# Patient Record
Sex: Female | Born: 1950 | Race: White | Hispanic: No | Marital: Single | Smoking: Current every day smoker
Health system: Southern US, Community
[De-identification: ages and names within clinical notes are randomized; demographics above are authoritative.]

## PROBLEM LIST (undated history)

## (undated) DIAGNOSIS — E785 Hyperlipidemia, unspecified: Secondary | ICD-10-CM

## (undated) DIAGNOSIS — K219 Gastro-esophageal reflux disease without esophagitis: Secondary | ICD-10-CM

## (undated) DIAGNOSIS — I10 Essential (primary) hypertension: Secondary | ICD-10-CM

## (undated) DIAGNOSIS — E079 Disorder of thyroid, unspecified: Secondary | ICD-10-CM

## (undated) DIAGNOSIS — N200 Calculus of kidney: Secondary | ICD-10-CM

## (undated) DIAGNOSIS — M1990 Unspecified osteoarthritis, unspecified site: Secondary | ICD-10-CM

## (undated) DIAGNOSIS — E039 Hypothyroidism, unspecified: Secondary | ICD-10-CM

## (undated) HISTORY — PX: FEMUR CLOSED REDUCTION: SHX939

## (undated) HISTORY — PX: SHOULDER SURGERY: SHX246

## (undated) HISTORY — PX: ABDOMINAL HYSTERECTOMY: SHX81

---

## 1998-01-06 ENCOUNTER — Ambulatory Visit (HOSPITAL_COMMUNITY): Admission: RE | Admit: 1998-01-06 | Discharge: 1998-01-06 | Payer: Self-pay | Admitting: Family Medicine

## 1999-02-10 ENCOUNTER — Ambulatory Visit (HOSPITAL_COMMUNITY): Admission: RE | Admit: 1999-02-10 | Discharge: 1999-02-10 | Payer: Self-pay | Admitting: Family Medicine

## 1999-03-05 ENCOUNTER — Ambulatory Visit (HOSPITAL_COMMUNITY): Admission: RE | Admit: 1999-03-05 | Discharge: 1999-03-05 | Payer: Self-pay | Admitting: Gastroenterology

## 2000-02-07 ENCOUNTER — Ambulatory Visit (HOSPITAL_BASED_OUTPATIENT_CLINIC_OR_DEPARTMENT_OTHER): Admission: RE | Admit: 2000-02-07 | Discharge: 2000-02-08 | Payer: Self-pay | Admitting: Orthopedic Surgery

## 2000-06-01 ENCOUNTER — Ambulatory Visit (HOSPITAL_COMMUNITY): Admission: RE | Admit: 2000-06-01 | Discharge: 2000-06-01 | Payer: Self-pay | Admitting: Family Medicine

## 2000-06-01 ENCOUNTER — Encounter: Payer: Self-pay | Admitting: Family Medicine

## 2000-06-12 ENCOUNTER — Encounter: Admission: RE | Admit: 2000-06-12 | Discharge: 2000-06-12 | Payer: Self-pay | Admitting: Family Medicine

## 2000-06-12 ENCOUNTER — Encounter: Payer: Self-pay | Admitting: Family Medicine

## 2000-08-07 ENCOUNTER — Ambulatory Visit (HOSPITAL_COMMUNITY): Admission: RE | Admit: 2000-08-07 | Discharge: 2000-08-07 | Payer: Self-pay | Admitting: Gastroenterology

## 2001-06-22 ENCOUNTER — Ambulatory Visit (HOSPITAL_COMMUNITY): Admission: RE | Admit: 2001-06-22 | Discharge: 2001-06-22 | Payer: Self-pay | Admitting: Family Medicine

## 2001-06-22 ENCOUNTER — Encounter: Payer: Self-pay | Admitting: Family Medicine

## 2002-06-24 ENCOUNTER — Ambulatory Visit (HOSPITAL_COMMUNITY): Admission: RE | Admit: 2002-06-24 | Discharge: 2002-06-24 | Payer: Self-pay | Admitting: Family Medicine

## 2002-06-24 ENCOUNTER — Encounter: Payer: Self-pay | Admitting: Family Medicine

## 2003-09-17 ENCOUNTER — Ambulatory Visit (HOSPITAL_COMMUNITY): Admission: RE | Admit: 2003-09-17 | Discharge: 2003-09-17 | Payer: Self-pay | Admitting: Family Medicine

## 2003-12-22 ENCOUNTER — Ambulatory Visit (HOSPITAL_COMMUNITY): Admission: RE | Admit: 2003-12-22 | Discharge: 2003-12-22 | Payer: Self-pay | Admitting: Orthopedic Surgery

## 2004-02-09 ENCOUNTER — Ambulatory Visit (HOSPITAL_BASED_OUTPATIENT_CLINIC_OR_DEPARTMENT_OTHER): Admission: RE | Admit: 2004-02-09 | Discharge: 2004-02-09 | Payer: Self-pay | Admitting: Orthopedic Surgery

## 2004-02-09 ENCOUNTER — Ambulatory Visit (HOSPITAL_COMMUNITY): Admission: RE | Admit: 2004-02-09 | Discharge: 2004-02-09 | Payer: Self-pay | Admitting: Orthopedic Surgery

## 2004-10-04 ENCOUNTER — Ambulatory Visit (HOSPITAL_COMMUNITY): Admission: RE | Admit: 2004-10-04 | Discharge: 2004-10-04 | Payer: Self-pay | Admitting: Family Medicine

## 2005-09-09 ENCOUNTER — Ambulatory Visit (HOSPITAL_COMMUNITY): Admission: RE | Admit: 2005-09-09 | Discharge: 2005-09-09 | Payer: Self-pay | Admitting: Gastroenterology

## 2005-10-06 ENCOUNTER — Ambulatory Visit (HOSPITAL_COMMUNITY): Admission: RE | Admit: 2005-10-06 | Discharge: 2005-10-06 | Payer: Self-pay | Admitting: Family Medicine

## 2006-10-09 ENCOUNTER — Ambulatory Visit (HOSPITAL_COMMUNITY): Admission: RE | Admit: 2006-10-09 | Discharge: 2006-10-09 | Payer: Self-pay | Admitting: Family Medicine

## 2007-10-12 ENCOUNTER — Ambulatory Visit (HOSPITAL_COMMUNITY): Admission: RE | Admit: 2007-10-12 | Discharge: 2007-10-12 | Payer: Self-pay | Admitting: Family Medicine

## 2008-10-17 ENCOUNTER — Ambulatory Visit (HOSPITAL_COMMUNITY): Admission: RE | Admit: 2008-10-17 | Discharge: 2008-10-17 | Payer: Self-pay | Admitting: Family Medicine

## 2009-07-15 ENCOUNTER — Ambulatory Visit: Payer: Self-pay | Admitting: Sports Medicine

## 2009-07-15 DIAGNOSIS — M216X9 Other acquired deformities of unspecified foot: Secondary | ICD-10-CM

## 2009-07-15 DIAGNOSIS — M2170 Unequal limb length (acquired), unspecified site: Secondary | ICD-10-CM

## 2009-07-15 DIAGNOSIS — M7750 Other enthesopathy of unspecified foot: Secondary | ICD-10-CM

## 2009-08-31 ENCOUNTER — Ambulatory Visit: Payer: Self-pay | Admitting: Sports Medicine

## 2009-10-19 ENCOUNTER — Ambulatory Visit (HOSPITAL_COMMUNITY): Admission: RE | Admit: 2009-10-19 | Discharge: 2009-10-19 | Payer: Self-pay | Admitting: Family Medicine

## 2010-08-31 ENCOUNTER — Ambulatory Visit (HOSPITAL_COMMUNITY)
Admission: RE | Admit: 2010-08-31 | Discharge: 2010-08-31 | Payer: Self-pay | Source: Home / Self Care | Admitting: Gastroenterology

## 2010-10-25 ENCOUNTER — Ambulatory Visit (HOSPITAL_COMMUNITY)
Admission: RE | Admit: 2010-10-25 | Discharge: 2010-10-25 | Payer: Self-pay | Source: Home / Self Care | Attending: Family Medicine | Admitting: Family Medicine

## 2010-10-27 ENCOUNTER — Other Ambulatory Visit: Payer: Self-pay | Admitting: Family Medicine

## 2010-10-27 DIAGNOSIS — R928 Other abnormal and inconclusive findings on diagnostic imaging of breast: Secondary | ICD-10-CM

## 2010-10-29 ENCOUNTER — Ambulatory Visit
Admission: RE | Admit: 2010-10-29 | Discharge: 2010-10-29 | Disposition: A | Discharge status: Home / Self Care | Payer: Commercial Managed Care - PPO | Source: Ambulatory Visit | Attending: Family Medicine | Admitting: Family Medicine

## 2010-10-29 DIAGNOSIS — R928 Other abnormal and inconclusive findings on diagnostic imaging of breast: Secondary | ICD-10-CM

## 2010-12-01 ENCOUNTER — Encounter: Payer: Self-pay | Admitting: *Deleted

## 2010-12-30 ENCOUNTER — Other Ambulatory Visit: Payer: Self-pay | Admitting: Family Medicine

## 2011-01-03 ENCOUNTER — Other Ambulatory Visit: Payer: Self-pay | Admitting: Family Medicine

## 2011-01-03 ENCOUNTER — Ambulatory Visit
Admission: RE | Admit: 2011-01-03 | Discharge: 2011-01-03 | Disposition: A | Discharge status: Home / Self Care | Payer: Commercial Managed Care - PPO | Source: Ambulatory Visit | Attending: Family Medicine | Admitting: Family Medicine

## 2011-02-11 NOTE — Op Note (Signed)
NAME:  Marie Dominguez, Marie Dominguez                  ACCOUNT NO.:  [id number]   MEDICAL RECORD NO.:  [id number]          PATIENT TYPE:  AMB   LOCATION:  ENDO                         FACILITY:  [facility]   PHYSICIAN:  Bernette Redbird, M.D.   DATE OF BIRTH:  1951-09-04   DATE OF PROCEDURE:  09/09/2005  DATE OF DISCHARGE:                                 OPERATIVE REPORT   PROCEDURE:  Colonoscopy.   INDICATION:  Strong family history of colon cancer in a 60 year old female,  with two prior colonoscopies having been negative.   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   PROCEDURE:  The nature, purpose and risks of the procedure were familiar to  the patient from prior examinations and she provided written consent.  Sedation was fentanyl 100 mcg and Versed 10 mg IV without arrhythmias or  desaturation.  The Olympus adjustable-tension pediatric video colonoscope  was quite easily advanced to the terminal ileum, using little bit of  external abdominal compression to control looping.  The terminal ileum  looked normal.  Pullback was then performed.  The quality of the prep was  excellent.  It is felt that all areas were well-seen.   The patient did have significant melanosis coli, which appeared to be a new  finding few finding compared to her last exam five years ago, but was  otherwise normal, without evidence of polyps, cancer, colitis, vascular  malformations or diverticulosis.  A retroflexed view of the rectum and  reinspection of the rectum of the rectum were unremarkable.   No biopsies were obtained.  The patient tolerated the procedure well, and  there were no apparent complications.   IMPRESSION:  Melanosis coli, otherwise normal exam in a patient with a  strong family history of colon cancer (V16.0).   PLAN:  Repeat colonoscopy in five years.           ______________________________  Bernette Redbird, M.D.     RB/MEDQ  D:  09/09/2005  T:  09/12/2005  Job:   [id number]   cc:   Duncan Dull, M.D.  Fax: [phone]

## 2011-02-11 NOTE — Procedures (Signed)
[place]. [facility]  Patient:    Marie Dominguez, Marie Dominguez                         MRN: [id number] Proc. Date: 08/07/00 Adm. Date:  [id number] Attending:  Rich Brave CC:         Duncan Dull, M.D.   Procedure Report  PROCEDURE PERFORMED:  Colonoscopy.  ENDOSCOPIST:  Florencia Reasons, M.D.  INDICATIONS FOR PROCEDURE:  Screening for colon cancer in a 60 year old female who last had colonoscopy five years ago at which time it was normal.  FINDINGS:  Normal exam.  DESCRIPTION OF PROCEDURE:  The nature, purpose and risks of the procedure were familiar to the patient from prior examination and she provided written consent.  Sedation was fentanyl 80 mcg and Versed 8 mg IV without arrhythmias or desaturation.  The Olympus pediatric video colonoscope was advanced to the terminal ileum without undue difficulty, applying a little bit of external abdominal compression to facilitate passage.  Pullback was then performed.  The quality of the prep was excellent and it is felt that all areas were well seen.  This was a normal examination.  Specifically, no polyps, cancer, colitis, vascular malformations or diverticular disease were observed and retroflexion in the rectum was unremarkable.  No biopsies were obtanied.  The patient tolerated the procedure well and there were no apparent complications.  IMPRESSION:  Normal screening colonoscopy in a patient with a family history of colon cancer.  PLAN:  Follow-up colonoscopy in five years in view of the family history.  DD:  08/07/00 TD:  08/07/00 Job: [id number]

## 2011-02-11 NOTE — Op Note (Signed)
[place]. [facility]  Patient:    Marie Dominguez, Marie Dominguez                         MRN: [id number] Proc. Date: 02/07/00 Adm. Date:  [id number] Attending:  Twana First                           Operative Report  PREOPERATIVE DIAGNOSIS:  Right shoulder rotator cuff tendinitis and impingement.  POSTOPERATIVE DIAGNOSES: 1. Right shoulder rotator cuff tear. 2. Right shoulder impingement with partial labrum tear.  PROCEDURES: 1. Right shoulder examination under anesthesia, followed by arthroscopic    partial labral tear debridement and subacromial decompression. 2. Right shoulder open rotator cuff repair.  SURGEON:  Elana Alm. Thurston Hole, M.D.  ASSISTANT:  Janine Limbo, P.A.  ANESTHESIA:  General.  OPERATIVE TIME:   45 minutes.  COMPLICATIONS:  None.  INDICATIONS:  [name] is a 60 year old woman, who has had one to two years of increasing right shoulder pain with signs and symptoms consistent with rotator cuff tendinitis and impingement.  She has failed conservative care and is now to undergo arthroscopy.  DESCRIPTION OF PROCEDURE:  [name] was brought to the operating room on Feb 07, 2000 after supraclavicular block had been placed in the holding room. Placed on the operating room table in supine position.  After an adequate level of general anesthesia was obtained, her right shoulder was examined under anesthesia.  She had full range of motion.  Shoulder was stable to ligamentous examination.  She was placed in a beach chair position and her shoulder and arm was prepped using sterile Betadine and draped using sterile technique.  Originally, through a posterior arthroscopic portal, the arthroscope with a pump attachment was placed and through an anterior portal, an arthroscopic probe was placed.  On initial inspection, the articular cartilage and the glenohumeral joint showed mild grade 1 and 2 changes. Anterior labrum was intact.  She had a very  prominent middle glenohumeral ligament, which was left undisturbed.  Superior labrum partial tearing 25-30%, which was debrided.  Biceps tendon anchor was hypermobile, but did not appear to be torn.  Biceps tendon showed a 25% partial tear, which was debrided. Posterior labrum was intact. Inferior capsular recess was free of pathology. Rotator cuff showed a complete tear to the supraspinatus and partial tear of the infraspinatus.  The subscapularis was intact.  Subacromial space was entered and a lateral arthroscopic portal was made.  Subtotal bursectomy was carried out.  Subacromial decompression was carried out removing 6-8 mm of the undersurface of the anterior, anterolateral and anteromedial acromion.  At this point, through the lateral portal, a 3 cm deltoid-splitting incision was made.  Underlying subcutaneous tissues were incised in line with the skin incision.  Rotator cuff tear was easily visualized and the edges were sharply debrided.  A small trough made in the greater tuberosity.  Initially, the tendon was repaired tendon to tendon with #2 Panacryl suture and then through two drill holes in the greater tuberosity, the tendon was tied down to the greater tuberosity and tied over bone thus reapproximating the tendon back to itself, as well as, to a trough in the greater tuberosity.  Firm fixation repair was achieved.  The shoulder could then be brought through a full range of motion with no impingement on the repair after this was done.  Wound was irrigated and closed using interrupted 0  and 2-0 Vicryl sutures, 3-0 Prolene in the subcuticular layers, Steri-Strips were applied.  The wound injected with 0.25% Marcaine with epinephrine.  Sterile dressings and a sling applied and the patient awakened and taken to recovery room in stable condition.  FOLLOW-UP CARE:   [name] will be followed overnight at the recovery care center for IV pain control and neurovascular monitoring.   Discharge tomorrow on Percocet and Naprosyn.  See me back in my office in a week for sutures out and follow-up. DD:  02/07/00 TD:  02/08/00 Job: [id number]

## 2011-02-11 NOTE — Op Note (Signed)
NAME:  Marie Dominguez, Marie Dominguez                            ACCOUNT NO.:  [id number]   MEDICAL RECORD NO.:  [id number]                   PATIENT TYPE:  AMB   LOCATION:  DSC                                  FACILITY:  [facility]   PHYSICIAN:  Robert A. Thurston Hole, M.D.              DATE OF BIRTH:  [birth year]   DATE OF PROCEDURE:  02/09/2004  DATE OF DISCHARGE:                                 OPERATIVE REPORT   PREOPERATIVE DIAGNOSES:  1. Left shoulder rotator cuff tear.  2. Left shoulder partial labrum tear.  3. Left shoulder impingement.   POSTOPERATIVE DIAGNOSES:  1. Left shoulder rotator cuff tear.  2. Left shoulder partial labrum tear.  3. Left shoulder impingement.   OPERATION PERFORMED:  1. Left shoulder examination under anesthesia followed by arthroscopically     assisted rotator cuff repair using Arthrex suture anchor times one.  2. Left shoulder partial labral tear debridement.  3. Left shoulder subacromial decompression.   SURGEON:  Elana Alm. Thurston Hole, M.D.   ASSISTANT:  Julien Girt, P.A.   ANESTHESIA:  General.   OPERATIVE TIME:  One hour.   COMPLICATIONS:  None.   INDICATIONS FOR PROCEDURE:  Marie Dominguez is a 60 year old woman who has had  three to four months of increasing left shoulder pain with exam and MRI  documenting a rotator cuff tear who has failed conservative care and is now  to undergo arthroscopy and repair.   DESCRIPTION OF PROCEDURE:  Marie Dominguez was brought to the operating room on Feb 09, 2004 after an interscalene block had been placed in the holding room by  anesthesia for postoperative pain control.  She was placed on the operating  table in supine position.  After being placed under general anesthesia, her  left shoulder was examined under anesthesia.  She had full range of motion  and her shoulder was stable to ligamentous exam.  She was then placed in  beach chair position and her shoulder and arm were prepped using sterile  DuraPrep and draped using  sterile technique.  Originally through a posterior  arthroscopic portal, the arthroscope with a pump attached was placed and  through an anterior portal, an arthroscopic probe was placed.  On initial  inspection, the articular cartilage in the glenohumeral joint was intact.  Anterior superior labrum, partial tear 25 to 30% which was debrided, biceps  tendon anchor and biceps tendon was intact.  Inferior labrum and anterior  inferior glenohumeral ligament complex was intact.  The posterior labrum was  intact.  The rotator cuff showed a tear of the supraspinatus which was a 90%  near complete tear and this was partially debrided arthroscopically.  The  rest of the rotator cuff was intact.  The inferior capsular recess was free  of pathology.  The subacromial space was entered and a lateral arthroscopic  portal was made.  Moderately thickened bursitis was resected.  The  rotator  cuff tear was further visualized and it was further debrided  arthroscopically.  Impingement was noted and a subacromial decompression was  carried out removing 6 mm of the undersurface of the anterior, anterolateral  and anteromedial acromion and coracoacromial ligament release carried out as  well.  The acromioclavicular joint was not disturbed.  After this was done,  then through an accessory portal, a 5.0 mm Arthrex suture anchor was placed  in the greater tuberosity.  Each of the sutures was then passed through the  rotator cuff tear and tied down arthroscopically, thus repairing the rotator  cuff tear back down to the greater tuberosity with a firm and tight repair.  After this was done, no further pathology was noted.  The shoulder could be  brought through a full range of motion with no impingement on the repair.  At this point it was felt that all of the pathology had been satisfactorily  addressed.  The instruments were removed.  Portals were closed with 3-0  nylon suture.  Sterile dressings and a sling  applied.  The patient was  awakened and taken to the recovery room in stable condition.   FOLLOW UP:  Marie Dominguez will be followed as an outpatient on Percocet for pain  and Naprosyn.  See her back in the office in a week for suture removal and  follow-up.  Begin early physical therapy for passive range of motion only  times six weeks, then active thereafter.                                               Robert A. Thurston Hole, M.D.    RAW/MEDQ  D:  02/09/2004  T:  02/09/2004  Job:  [id number]

## 2011-07-27 ENCOUNTER — Ambulatory Visit (INDEPENDENT_AMBULATORY_CARE_PROVIDER_SITE_OTHER): Payer: Commercial Managed Care - PPO | Admitting: Family Medicine

## 2011-07-27 VITALS — BP 124/80 | Ht 68.0 in | Wt 185.0 lb

## 2011-07-27 DIAGNOSIS — S93409A Sprain of unspecified ligament of unspecified ankle, initial encounter: Secondary | ICD-10-CM

## 2011-07-27 DIAGNOSIS — M25579 Pain in unspecified ankle and joints of unspecified foot: Secondary | ICD-10-CM

## 2011-07-27 DIAGNOSIS — M79609 Pain in unspecified limb: Secondary | ICD-10-CM

## 2011-07-27 NOTE — Patient Instructions (Signed)
Please cut your regular walking from 2 miles a day to one mile a day for about three days. Ice for 15-20 minutes after each exercise period. We have fitted you for a brace---wear it during walking activities. I have given you  A handout for ankle exercises---you can start that on day four if you are feeling better. If you are not progressing back to normal within a week, give [place] a call at [phone].

## 2011-07-28 NOTE — Progress Notes (Signed)
  Subjective:    Patient ID: Marie Dominguez, female    DOB: [birth year], 60 y.o.   MRN: [id number]  HPI  Right ankle pain since yesterday. Was walking on flat ground in flat shoes when she felt her ankle and felt immediate pain. Did not step in a hole. Since then she has had intermittent sharp pains when she walks. Pain is in the anterior part of the ankle. Has not had any swelling of the ankle, has noted no warmth or erythema of the ankle. 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 History of femur fracture  many years ago. 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 Review of Systems Denies numbness in the foot. Denies fever.    Objective:   Physical Exam Vital signs reviewed. GENERAL: Well developed, well nourished, no acute distress ANKLE: Right ankle normal in point on anterior drawer. Painless and full range of motion in E. version and inversion. Subtalar joint motion is normal. Mild tenderness to palpation over the ATF area. There is no bruising or swelling here. ULTRASOUND:  Small amount of edema around the ATF area but no true ankle effusion.       Assessment & Plan:  #1. ATF sprain mild. Put her in an ASO. See patient instructions. Rehabilitation handout given. She's not improving within the next 4-7 days she'll let us know.

## 2011-10-25 ENCOUNTER — Other Ambulatory Visit (HOSPITAL_COMMUNITY): Payer: Self-pay | Admitting: Family Medicine

## 2011-10-25 DIAGNOSIS — Z1231 Encounter for screening mammogram for malignant neoplasm of breast: Secondary | ICD-10-CM

## 2011-11-06 ENCOUNTER — Encounter (HOSPITAL_COMMUNITY): Payer: Self-pay

## 2011-11-06 ENCOUNTER — Emergency Department (HOSPITAL_COMMUNITY)
Admission: EM | Admit: 2011-11-06 | Discharge: 2011-11-06 | Disposition: A | Discharge status: Home / Self Care | Payer: 59 | Source: Home / Self Care | Attending: Family Medicine | Admitting: Family Medicine

## 2011-11-06 DIAGNOSIS — S0083XA Contusion of other part of head, initial encounter: Secondary | ICD-10-CM

## 2011-11-06 HISTORY — DX: Essential (primary) hypertension: I10

## 2011-11-06 HISTORY — DX: Disorder of thyroid, unspecified: E07.9

## 2011-11-06 HISTORY — DX: Gastro-esophageal reflux disease without esophagitis: K21.9

## 2011-11-06 NOTE — ED Notes (Signed)
Pt states she fell yesterday while walking, landed face first on sidewalk and broke her glasses. Has no loss of cons. Denies headache or n/v. Pt has large amount of brusing to eyes, bridge of nose and forehead.

## 2011-11-06 NOTE — ED Provider Notes (Addendum)
History     CSN: [id number]  Arrival date & time 11/06/11  1400   First MD Initiated Contact with Patient 11/06/11 1433      Chief Complaint  Patient presents with  . Fall    edema and brusing to bilateral eyes, bridge of nose and forehead    (Consider location/radiation/quality/duration/timing/severity/associated sxs/prior treatment) Patient is a 61 y.o. female presenting with facial injury. The history is provided by the patient.  Facial Injury  The incident occurred yesterday. The incident occurred in the street. The injury mechanism was a fall. Context: walking and ankle turned resulting in fall onto face. Pertinent negatives include no headaches.    Past Medical History  Diagnosis Date  . Thyroid disease   . Hypertension   . Acid reflux     Past Surgical History  Procedure Date  . Femur closed reduction   . Shoulder surgery   . Abdominal hysterectomy     History reviewed. No pertinent family history.  History  Substance Use Topics  . Smoking status: Never Smoker   . Smokeless tobacco: Not on file  . Alcohol Use: 0.6 oz/week    1 Glasses of wine per week    OB History    Grav Para Term Preterm Abortions TAB SAB Ect Mult Living                  Review of Systems  Constitutional: Negative.   Skin: Positive for color change.  Neurological: Negative for dizziness and headaches.    Allergies  Review of patient's allergies indicates no known allergies.  Home Medications   Current Outpatient Rx  Name Route Sig Dispense Refill  . CITALOPRAM HYDROBROMIDE 10 MG PO TABS Oral Take 10 mg by mouth daily.    Marland Kitchen ESOMEPRAZOLE MAGNESIUM 20 MG PO CPDR Oral Take 20 mg by mouth daily before breakfast.    . LEVOTHYROXINE SODIUM 100 MCG PO TABS Oral Take 100 mcg by mouth daily.    Marland Kitchen LISINOPRIL 10 MG PO TABS Oral Take 10 mg by mouth daily.    Marland Kitchen ROSUVASTATIN CALCIUM 10 MG PO TABS Oral Take 10 mg by mouth daily.    Marland Kitchen DICLOFENAC SODIUM 1 % TD GEL Topical Apply topically 4  (four) times daily. 1 gram to the affected area       BP 143/85  Pulse 63  Temp(Src) 99 F (37.2 C) (Oral)  Resp 18  SpO2 98%  Physical Exam  Nursing note and vitals reviewed. Constitutional: She is oriented to person, place, and time. She appears well-developed and well-nourished.  HENT:  Head: Normocephalic. Head is with contusion.    Right Ear: External ear normal.  Left Ear: External ear normal.  Mouth/Throat: Oropharynx is clear and moist.  Eyes: Conjunctivae and EOM are normal. Pupils are equal, round, and reactive to light.  Neck: Normal range of motion. Neck supple.  Musculoskeletal: Normal range of motion.  Neurological: She is alert and oriented to person, place, and time.  Skin: Skin is warm and dry.  Psychiatric: She has a normal mood and affect.    ED Course  Procedures (including critical care time)  Labs Reviewed - No data to display No results found.   1. Forehead contusion       MDM          Barkley Bruns, MD 11/06/11 1508  Barkley Bruns, MD 11/11/11 [phone]

## 2011-11-22 ENCOUNTER — Ambulatory Visit (HOSPITAL_COMMUNITY): Payer: Commercial Managed Care - PPO

## 2011-12-13 ENCOUNTER — Ambulatory Visit (HOSPITAL_COMMUNITY)
Admission: RE | Admit: 2011-12-13 | Discharge: 2011-12-13 | Disposition: A | Discharge status: Home / Self Care | Payer: 59 | Source: Ambulatory Visit | Attending: Family Medicine | Admitting: Family Medicine

## 2011-12-13 DIAGNOSIS — Z1231 Encounter for screening mammogram for malignant neoplasm of breast: Secondary | ICD-10-CM | POA: Insufficient documentation

## 2012-06-17 ENCOUNTER — Encounter (HOSPITAL_COMMUNITY): Payer: Self-pay | Admitting: Emergency Medicine

## 2012-06-17 ENCOUNTER — Emergency Department (HOSPITAL_COMMUNITY)
Admission: EM | Admit: 2012-06-17 | Discharge: 2012-06-17 | Disposition: A | Discharge status: Nursing Home (Includes ICF) | Payer: 59 | Source: Home / Self Care | Attending: Emergency Medicine | Admitting: Emergency Medicine

## 2012-06-17 ENCOUNTER — Emergency Department (INDEPENDENT_AMBULATORY_CARE_PROVIDER_SITE_OTHER): Payer: 59

## 2012-06-17 ENCOUNTER — Encounter (HOSPITAL_COMMUNITY): Payer: Self-pay

## 2012-06-17 ENCOUNTER — Emergency Department (HOSPITAL_COMMUNITY): Payer: 59

## 2012-06-17 ENCOUNTER — Emergency Department (HOSPITAL_COMMUNITY)
Admission: EM | Admit: 2012-06-17 | Discharge: 2012-06-17 | Disposition: A | Discharge status: Home / Self Care | Payer: 59 | Attending: Emergency Medicine | Admitting: Emergency Medicine

## 2012-06-17 DIAGNOSIS — N201 Calculus of ureter: Secondary | ICD-10-CM

## 2012-06-17 DIAGNOSIS — N200 Calculus of kidney: Secondary | ICD-10-CM

## 2012-06-17 DIAGNOSIS — I10 Essential (primary) hypertension: Secondary | ICD-10-CM | POA: Insufficient documentation

## 2012-06-17 DIAGNOSIS — E039 Hypothyroidism, unspecified: Secondary | ICD-10-CM | POA: Insufficient documentation

## 2012-06-17 DIAGNOSIS — Z90710 Acquired absence of both cervix and uterus: Secondary | ICD-10-CM | POA: Insufficient documentation

## 2012-06-17 DIAGNOSIS — N23 Unspecified renal colic: Secondary | ICD-10-CM

## 2012-06-17 DIAGNOSIS — R109 Unspecified abdominal pain: Secondary | ICD-10-CM | POA: Insufficient documentation

## 2012-06-17 DIAGNOSIS — Z79899 Other long term (current) drug therapy: Secondary | ICD-10-CM | POA: Insufficient documentation

## 2012-06-17 LAB — URINALYSIS, ROUTINE W REFLEX MICROSCOPIC
Bilirubin Urine: NEGATIVE
Glucose, UA: 100 mg/dL — AB
Hgb urine dipstick: NEGATIVE
Ketones, ur: 15 mg/dL — AB
Leukocytes, UA: NEGATIVE
Nitrite: NEGATIVE
Protein, ur: 30 mg/dL — AB
Specific Gravity, Urine: 1.021 (ref 1.005–1.030)
Urobilinogen, UA: 0.2 mg/dL (ref 0.0–1.0)
pH: 7 (ref 5.0–8.0)

## 2012-06-17 LAB — POCT URINALYSIS DIP (DEVICE)
Glucose, UA: NEGATIVE mg/dL
Protein, ur: 30 mg/dL — AB
Specific Gravity, Urine: 1.02 (ref 1.005–1.030)
pH: 7 (ref 5.0–8.0)

## 2012-06-17 LAB — URINE MICROSCOPIC-ADD ON

## 2012-06-17 MED ORDER — HYDROMORPHONE HCL PF 1 MG/ML IJ SOLN
1.0000 mg | Freq: Once | INTRAMUSCULAR | Status: AC
  Administered 2012-06-17: 1 mg via INTRAVENOUS
  Filled 2012-06-17: qty 1

## 2012-06-17 MED ORDER — MORPHINE SULFATE 2 MG/ML IJ SOLN
2.0000 mg | Freq: Once | INTRAMUSCULAR | Status: AC
  Administered 2012-06-17: 2 mg via INTRAMUSCULAR

## 2012-06-17 MED ORDER — ONDANSETRON 8 MG PO TBDP: 8.0000 mg | ORAL_TABLET | Freq: Two times a day (BID) | ORAL | Status: DC | PRN

## 2012-06-17 MED ORDER — ONDANSETRON HCL 4 MG/2ML IJ SOLN
4.0000 mg | Freq: Once | INTRAMUSCULAR | Status: AC
  Administered 2012-06-17: 4 mg via INTRAVENOUS
  Filled 2012-06-17: qty 2

## 2012-06-17 MED ORDER — HYDROCODONE-ACETAMINOPHEN 5-325 MG PO TABS: ORAL_TABLET | ORAL | Status: DC

## 2012-06-17 MED ORDER — MORPHINE SULFATE 2 MG/ML IJ SOLN
INTRAMUSCULAR | Status: AC
  Filled 2012-06-17: qty 1

## 2012-06-17 MED ORDER — KETOROLAC TROMETHAMINE 30 MG/ML IJ SOLN
30.0000 mg | Freq: Once | INTRAMUSCULAR | Status: AC
  Administered 2012-06-17: 30 mg via INTRAVENOUS
  Filled 2012-06-17: qty 1

## 2012-06-17 MED ORDER — ONDANSETRON HCL 4 MG PO TABS
4.0000 mg | ORAL_TABLET | Freq: Once | ORAL | Status: AC
  Administered 2012-06-17: 4 mg via ORAL

## 2012-06-17 MED ORDER — ONDANSETRON 4 MG PO TBDP
ORAL_TABLET | ORAL | Status: AC
  Filled 2012-06-17: qty 1

## 2012-06-17 NOTE — ED Provider Notes (Signed)
History     CSN: [id number]  Arrival date & time 06/17/12  1106   First MD Initiated Contact with Patient 06/17/12 1248      Chief Complaint  Patient presents with  . Flank Pain    (Consider location/radiation/quality/duration/timing/severity/associated sxs/prior treatment) HPI Comments: Pt developed back pain more to left flank that moved some to left lower area associated with urgency, no frank hematuria, but also N/V.  She went to urgent care, given IM meds, which helped, but then got worse again.  No fevers, chills, pain was rather abrupt earlier today.  No prior h/o kidney stones, has had UTI's in the past, but not often and never this severe.  No rash, trauma.  She was referred here for further management and diagnosis.  Pt had seen Dr. 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  Otherwise PCP is Dr. Kevan Ny.    Patient is a 61 y.o. female presenting with flank pain. The history is provided by the patient and medical records.  Flank Pain Associated symptoms include abdominal pain.    Past Medical History  Diagnosis Date  . Thyroid disease   . Hypertension   . Acid reflux     Past Surgical History  Procedure Date  . Femur closed reduction   . Shoulder surgery   . Abdominal hysterectomy     Family History  Problem Relation Age of Onset  . Cancer Other     History  Substance Use Topics  . Smoking status: Never Smoker   . Smokeless tobacco: Not on file  . Alcohol Use: 0.6 oz/week    1 Glasses of wine per week    OB History    Grav Para Term Preterm Abortions TAB SAB Ect Mult Living                  Review of Systems  Constitutional: Negative for fever and chills.  Gastrointestinal: Positive for nausea, vomiting and abdominal pain. Negative for diarrhea.  Genitourinary: Positive for flank pain.  Musculoskeletal: Positive for back pain.  Skin: Negative for rash.  All other systems reviewed and are  negative.    Allergies  Review of patient's allergies indicates no known allergies.  Home Medications   Current Outpatient Rx  Name Route Sig Dispense Refill  . CITALOPRAM HYDROBROMIDE 10 MG PO TABS Oral Take 10 mg by mouth daily.    Marland Kitchen DICLOFENAC SODIUM 1 % TD GEL Topical Apply topically 4 (four) times daily. 1 gram to the affected area     . ESOMEPRAZOLE MAGNESIUM 20 MG PO CPDR Oral Take 20 mg by mouth daily before breakfast.    . OMEGA-3 FATTY ACIDS 1000 MG PO CAPS Oral Take 2 g by mouth 2 (two) times daily.    Marland Kitchen LEVOTHYROXINE SODIUM 100 MCG PO TABS Oral Take 100 mcg by mouth daily.    Marland Kitchen LISINOPRIL 10 MG PO TABS Oral Take 10 mg by mouth daily.    . MULTI-VITAMIN/MINERALS PO TABS Oral Take 1 tablet by mouth daily.    Marland Kitchen ROSUVASTATIN CALCIUM 10 MG PO TABS Oral Take 10 mg by mouth daily.    Marland Kitchen VITAMIN D (ERGOCALCIFEROL) 50000 UNITS PO CAPS Oral Take 50,000 Units by mouth every 7 (seven) days. Takes on Thursday    . HYDROCODONE-ACETAMINOPHEN 5-325 MG PO TABS  1-2 tablets po q 6 hours prn moderate to severe pain 20 tablet 0  . ONDANSETRON 8 MG PO TBDP Oral  Take 1 tablet (8 mg total) by mouth every 12 (twelve) hours as needed for nausea. 20 tablet 0    BP 114/78  Pulse 70  Temp 97.6 F (36.4 C) (Oral)  Resp 18  SpO2 96%  Physical Exam  Nursing note and vitals reviewed. Constitutional: She appears well-developed and well-nourished.  HENT:  Head: Normocephalic and atraumatic.  Eyes: Pupils are equal, round, and reactive to light. No scleral icterus.  Neck: Normal range of motion. Neck supple.  Cardiovascular: Normal rate and regular rhythm.   Pulmonary/Chest: Effort normal.  Abdominal: Soft. There is tenderness. There is CVA tenderness. There is no rebound and no guarding.  Musculoskeletal:       Lumbar back: She exhibits normal range of motion, no bony tenderness, no swelling, no deformity and no spasm.  Neurological: She is alert.  Skin: Skin is warm, dry and intact. No rash  noted. No pallor.    ED Course  Procedures (including critical care time)  Labs Reviewed  URINALYSIS, ROUTINE W REFLEX MICROSCOPIC - Abnormal; Notable for the following:    Glucose, UA 100 (*)     Ketones, ur 15 (*)     Protein, ur 30 (*)     All other components within normal limits  URINE MICROSCOPIC-ADD ON - Abnormal; Notable for the following:    Bacteria, UA FEW (*)     All other components within normal limits   Ct Abdomen Pelvis Wo Contrast  06/17/2012  *RADIOLOGY REPORT*  Clinical Data: Flank pain  CT ABDOMEN AND PELVIS WITHOUT CONTRAST  Technique:  Multidetector CT imaging of the abdomen and pelvis was performed following the standard protocol without intravenous contrast.  Comparison: KUB same day  Findings: Lung bases are unremarkable.  Sagittal images of the spine shows osteopenia and mild degenerative changes lumbar spine.  Unenhanced liver is unremarkable.  Small hiatal hernia.  No calcified gallstones are noted within gallbladder.  Unenhanced pancreas, spleen and adrenal glands are unremarkable. Mild enlarged left kidney.  Mild left hydronephrosis.  Significant left perinephric stranding.  There is a staghorn calculus in the lower pole of the left kidney nonobstructive measures 1.1 x 1.3 cm. Nonobstructive calculus posterior aspect of the left kidney measures 3 mm.  Nonobstructive calculus in the lower pole of the right kidney measures 2.0 mm.  There is mild left periureteral stranding. Minimal prominent sized distal left ureter.  No calcified ureteral calculi are noted.  No small bowel obstruction.  No ascites or free air.  No adenopathy.  No pericecal inflammation.  Normal appendix is clearly visualized.  No calcified calculi are noted within urinary bladder.  The patient is status post hysterectomy.  No distal colonic obstruction.  A few diverticula are noted sigmoid colon without evidence of acute diverticulitis.  IMPRESSION: 1.  There is mild prominent size left kidney with  minimal left hydronephrosis.  Significant left perinephric stranding.  Mild left periureteral stranding.  Staghorn nonobstructive calculus lower pole of the left kidney measures 1.1 x 1.2 cm.  Findings may be due to the left urinary tract infection/inflammation, recent passed left ureteral calculus or left pyelonephritis.  Clinical correlation is necessary. 2.  Nonobstructive calcified calculus in lower pole of the right kidney measures 2 mm. 3.  Bilateral no calcified ureteral calculi are identified. 4.  Normal appendix.  No pericecal inflammation. 5.  Status post hysterectomy.   Original Report Authenticated By: Natasha Mead, M.D.    Dg Abd 1 View  06/17/2012  *RADIOLOGY REPORT*  Clinical Data:  Lower back pain and left flank pain for the past day, with hematuria.  ABDOMEN - 1 VIEW  Comparison: No priors.  Findings: Multiple calcifications are seen projecting over the left kidney, the largest of which measures approximately 10 mm.  There are multiple phleboliths in the pelvis on the left side, and there may be a distal left ureteral stone (although it is difficult to say for certain that this is not a phlebolith, this is does not appear to have a central lucency as typically seen with phleboliths) measuring approximately 4 mm in the expected location of the distal left ureter near the left ureterovesicular junction. Gas and stool is seen scattered throughout the colon extending to the distal rectum.  No pathologic distension of small bowel.  No gross pneumoperitoneum on this single supine view of the abdomen.  IMPRESSION: 1.  Multiple left sided calcifications suspicious for left renal calculi, and possible distal left ureteral calculus.  These findings could be better evaluated with noncontrast CT of the abdomen and pelvis.   Original Report Authenticated By: Florencia Reasons, M.D.      1. Ureteral colic   2. Staghorn renal calculus       MDM  Pt's pain improved with IV meds, nausea improved, no  further vomiting, tolerating PO's.  Results of CT and labs discussed at length with pt.  Discussed also with urologist on call who reviewed CT scan.  Safe to go on her work retreat next week and follow up at convenience afterwards to address staghorn calculi.  No infection present on UA here.  No fever.  Pt knows to watch for fevers, or signs of infection.  Rx for pain and nausea provided.  Urine strainer also given.          Gavin Pound. Ayani Ospina, MD 06/17/12 1623

## 2012-06-17 NOTE — Discharge Instructions (Signed)
Kidney Stones Kidney stones (ureteral lithiasis) are deposits that form inside your kidneys. The intense pain is caused by the stone moving through the urinary tract. When the stone moves, the ureter goes into spasm around the stone. The stone is usually passed in the urine.  CAUSES   A disorder that makes certain neck glands produce too much parathyroid hormone (primary hyperparathyroidism).   A buildup of uric acid crystals.   Narrowing (stricture) of the ureter.   A kidney obstruction present at birth (congenital obstruction).   Previous surgery on the kidney or ureters.   Numerous kidney infections.  SYMPTOMS   Feeling sick to your stomach (nauseous).   Throwing up (vomiting).   Blood in the urine (hematuria).   Pain that usually spreads (radiates) to the groin.   Frequency or urgency of urination.  DIAGNOSIS   Taking a history and physical exam.   Blood or urine tests.   Computerized X-ray scan (CT scan).   Occasionally, an examination of the inside of the urinary bladder (cystoscopy) is performed.  TREATMENT   Observation.   Increasing your fluid intake.   Surgery may be needed if you have severe pain or persistent obstruction.  The size, location, and chemical composition are all important variables that will determine the proper choice of action for you. Talk to your caregiver to better understand your situation so that you will minimize the risk of injury to yourself and your kidney.  HOME CARE INSTRUCTIONS   Drink enough water and fluids to keep your urine clear or pale yellow.   Strain all urine through the provided strainer. Keep all particulate matter and stones for your caregiver to see. The stone causing the pain may be as small as a grain of salt. It is very important to use the strainer each and every time you pass your urine. The collection of your stone will allow your caregiver to analyze it and verify that a stone has actually passed.   Only take  over-the-counter or prescription medicines for pain, discomfort, or fever as directed by your caregiver.   Make a follow-up appointment with your caregiver as directed.   Get follow-up X-rays if required. The absence of pain does not always mean that the stone has passed. It may have only stopped moving. If the urine remains completely obstructed, it can cause loss of kidney function or even complete destruction of the kidney. It is your responsibility to make sure X-rays and follow-ups are completed. Ultrasounds of the kidney can show blockages and the status of the kidney. Ultrasounds are not associated with any radiation and can be performed easily in a matter of minutes.  SEEK IMMEDIATE MEDICAL CARE IF:   Pain cannot be controlled with the prescribed medicine.   You have a fever.   The severity or intensity of pain increases over 18 hours and is not relieved by pain medicine.   You develop a new onset of abdominal pain.   You feel faint or pass out.  MAKE SURE YOU:   Understand these instructions.   Will watch your condition.   Will get help right away if you are not doing well or get worse.  Document Released: 09/12/2005 Document Revised: 09/01/2011 Document Reviewed: 01/08/2010 [employer] Patient Information 2012 [employer].    Narcotic and benzodiazepine use may cause drowsiness, slowed breathing or dependence.  Please use with caution and do not drive, operate machinery or watch young children alone while taking them.  Taking combinations of these   medications or drinking alcohol will potentiate these effects.    

## 2012-06-17 NOTE — ED Notes (Signed)
Pt c/o sudden onset of severe lower back pain that started at 6 a.m pain was through out lower back but now has centralized to lower left side. Pt has vomited several times this a. M. Some diarrhea. Pt states that she is experiencing urgency, denies pain with urinating/blood in urine and burning.

## 2012-06-17 NOTE — ED Notes (Addendum)
Pt from [facility] with c/o L flank pain.  Pt given ODT Zofran and morphine IM at [facility]  Pt had relief, but now rates pain 5/10.  X-ray at [facility] demonstrates L kidney stone.  Pt c/o some nausea with vomiting x 3 in the last 24 hrs.  Urinalysis completed at [facility].

## 2012-06-17 NOTE — ED Notes (Addendum)
Nausea, sob with exertion, get real tingling since 0600. Gave zofran under tongue and morphine shot for pain. Lt. Flank pain. From [facility].

## 2012-06-17 NOTE — ED Provider Notes (Addendum)
History     CSN: [id number]  Arrival date & time 06/17/12  0903   First MD Initiated Contact with Patient 06/17/12 0912      Chief Complaint  Patient presents with  . Urinary Tract Infection    lower back pain    (Consider location/radiation/quality/duration/timing/severity/associated sxs/prior treatment) HPI Comments: Patient presents to urgent care this morning complaining of severe back flank and lower abdominal pain that started suddenly around 6 AM this morning. Pain has changed locations in character seems to be constant with sudden exacerbations, likely, sharp in character. Patient has had several episodes of vomiting since early this morning and is feeling nauseous. Been having some difficulty to urinate, denies having observed any blood, and denies burning. Patient does describe the sensation that she can't urinate fully. Patient was perfectly fine as of last night, denies any recent injuries, fevers. Did also described some loose stools.  The history is provided by the patient.    Past Medical History  Diagnosis Date  . Thyroid disease   . Hypertension   . Acid reflux     Past Surgical History  Procedure Date  . Femur closed reduction   . Shoulder surgery   . Abdominal hysterectomy     Family History  Problem Relation Age of Onset  . Cancer Other     History  Substance Use Topics  . Smoking status: Never Smoker   . Smokeless tobacco: Not on file  . Alcohol Use: 0.6 oz/week    1 Glasses of wine per week    OB History    Grav Para Term Preterm Abortions TAB SAB Ect Mult Living                  Review of Systems  Constitutional: Positive for activity change and appetite change. Negative for fever, diaphoresis and fatigue.  Respiratory: Negative for cough and shortness of breath.   Gastrointestinal: Positive for abdominal pain. Negative for blood in stool.  Genitourinary: Positive for dysuria, frequency, flank pain and pelvic pain. Negative for vaginal  bleeding, vaginal discharge, genital sores and vaginal pain.  Musculoskeletal: Positive for back pain.  Skin: Negative for pallor and rash.  Neurological: Negative for dizziness.    Allergies  Review of patient's allergies indicates no known allergies.  Home Medications   Current Outpatient Rx  Name Route Sig Dispense Refill  . CITALOPRAM HYDROBROMIDE 10 MG PO TABS Oral Take 10 mg by mouth daily.    Marland Kitchen DICLOFENAC SODIUM 1 % TD GEL Topical Apply topically 4 (four) times daily. 1 gram to the affected area     . ESOMEPRAZOLE MAGNESIUM 20 MG PO CPDR Oral Take 20 mg by mouth daily before breakfast.    . LEVOTHYROXINE SODIUM 100 MCG PO TABS Oral Take 100 mcg by mouth daily.    Marland Kitchen LISINOPRIL 10 MG PO TABS Oral Take 10 mg by mouth daily.    Marland Kitchen ROSUVASTATIN CALCIUM 10 MG PO TABS Oral Take 10 mg by mouth daily.      BP 190/100  Pulse 55  Temp 98.5 F (36.9 C) (Oral)  Resp 22  SpO2 100%  Physical Exam  Nursing note and vitals reviewed. Constitutional: She appears well-developed. She has a sickly appearance. She appears distressed.  Abdominal: Soft. She exhibits no mass. There is no hepatosplenomegaly, splenomegaly or hepatomegaly. There is tenderness in the left upper quadrant and left lower quadrant. There is no rigidity, no rebound, no guarding, no CVA tenderness, no tenderness at McBurney's point  and negative Murphy's sign. No hernia. Hernia confirmed negative in the ventral area.    Neurological: She is alert.  Skin: Skin is warm.    ED Course  Procedures (including critical care time)  Labs Reviewed  POCT URINALYSIS DIP (DEVICE) - Abnormal; Notable for the following:    Hgb urine dipstick TRACE (*)     Protein, ur 30 (*)     Leukocytes, UA TRACE (*)  Biochemical Testing Only. Please order routine urinalysis from main lab if confirmatory testing is needed.   All other components within normal limits  URINE CULTURE   Dg Abd 1 View  06/17/2012  *RADIOLOGY REPORT*  Clinical Data:  Lower back pain and left flank pain for the past day, with hematuria.  ABDOMEN - 1 VIEW  Comparison: No priors.  Findings: Multiple calcifications are seen projecting over the left kidney, the largest of which measures approximately 10 mm.  There are multiple phleboliths in the pelvis on the left side, and there may be a distal left ureteral stone (although it is difficult to say for certain that this is not a phlebolith, this is does not appear to have a central lucency as typically seen with phleboliths) measuring approximately 4 mm in the expected location of the distal left ureter near the left ureterovesicular junction. Gas and stool is seen scattered throughout the colon extending to the distal rectum.  No pathologic distension of small bowel.  No gross pneumoperitoneum on this single supine view of the abdomen.  IMPRESSION: 1.  Multiple left sided calcifications suspicious for left renal calculi, and possible distal left ureteral calculus.  These findings could be better evaluated with noncontrast CT of the abdomen and pelvis.   Original Report Authenticated By: Florencia Reasons, M.D.      1. Ureteral calculus   2. Kidney calculi       MDM  Kidney and ureteral lithiasis- symptomatic- at [facility] with nausea vomiting and lower abdominal pain. Patient was partially responsive to pain management measures. Unable to tolerate oral fluids at this point. Observe patient for approximately an hour 30 minutes. F. transfer patient to the emergency department for further observation, pain management to be considered for CT kidney protocol. Although stone measures less than 5 mm under the lower third of her ureter patient is symptomatic and not tolerating oral fluids at this point. Kidney leaky as is noted incidentally have recommended patient that she will need to consult a urologist. Patient understands treatment plan followup care in necessity for transfer.        Jimmie Molly, MD 06/17/12 1110  Jimmie Molly, MD 06/17/12 [phone]

## 2012-06-17 NOTE — ED Notes (Signed)
pt

## 2012-06-18 LAB — URINE CULTURE
Colony Count: 1000
Special Requests: NORMAL

## 2012-06-20 LAB — POCT I-STAT, CHEM 8
BUN: 22 mg/dL (ref 6–23)
Calcium, Ion: 1.15 mmol/L (ref 1.13–1.30)
Chloride: 108 mEq/L (ref 96–112)
Creatinine, Ser: 1 mg/dL (ref 0.50–1.10)
Glucose, Bld: 131 mg/dL — ABNORMAL HIGH (ref 70–99)
HCT: 43 % (ref 36.0–46.0)
Hemoglobin: 14.6 g/dL (ref 12.0–15.0)
Potassium: 3.6 meq/L (ref 3.5–5.1)
Sodium: 141 mEq/L (ref 135–145)
TCO2: 23 mmol/L (ref 0–100)

## 2012-07-12 ENCOUNTER — Other Ambulatory Visit: Payer: Self-pay | Admitting: Urology

## 2012-07-24 ENCOUNTER — Other Ambulatory Visit (HOSPITAL_COMMUNITY): Payer: Self-pay | Admitting: Urology

## 2012-07-24 DIAGNOSIS — N200 Calculus of kidney: Secondary | ICD-10-CM

## 2012-08-01 ENCOUNTER — Encounter (HOSPITAL_COMMUNITY)
Admission: RE | Admit: 2012-08-01 | Discharge: 2012-08-01 | Disposition: A | Discharge status: Home / Self Care | Payer: 59 | Source: Ambulatory Visit | Attending: Urology | Admitting: Urology

## 2012-08-01 ENCOUNTER — Encounter (HOSPITAL_COMMUNITY): Payer: Self-pay

## 2012-08-01 DIAGNOSIS — N1330 Unspecified hydronephrosis: Secondary | ICD-10-CM | POA: Insufficient documentation

## 2012-08-01 DIAGNOSIS — N200 Calculus of kidney: Secondary | ICD-10-CM

## 2012-08-01 DIAGNOSIS — R109 Unspecified abdominal pain: Secondary | ICD-10-CM | POA: Insufficient documentation

## 2012-08-01 HISTORY — DX: Calculus of kidney: N20.0

## 2012-08-01 MED ORDER — FUROSEMIDE 10 MG/ML IJ SOLN
40.0000 mg | Freq: Once | INTRAMUSCULAR | Status: AC
  Administered 2012-08-01: 42 mg via INTRAVENOUS
  Filled 2012-08-01: qty 4

## 2012-08-01 MED ORDER — TECHNETIUM TC 99M MERTIATIDE
15.7000 | Freq: Once | INTRAVENOUS | Status: AC | PRN
  Administered 2012-08-01: 15.7 via INTRAVENOUS

## 2012-08-09 ENCOUNTER — Encounter (HOSPITAL_COMMUNITY): Payer: Self-pay | Admitting: Pharmacy Technician

## 2012-08-13 ENCOUNTER — Encounter (HOSPITAL_COMMUNITY)
Admission: RE | Admit: 2012-08-13 | Discharge: 2012-08-13 | Disposition: A | Discharge status: Home / Self Care | Payer: 59 | Source: Ambulatory Visit | Attending: Urology | Admitting: Urology

## 2012-08-13 ENCOUNTER — Encounter (HOSPITAL_COMMUNITY): Payer: Self-pay

## 2012-08-13 ENCOUNTER — Ambulatory Visit (HOSPITAL_COMMUNITY)
Admission: RE | Admit: 2012-08-13 | Discharge: 2012-08-13 | Disposition: A | Discharge status: Home / Self Care | Payer: 59 | Source: Ambulatory Visit | Attending: Urology | Admitting: Urology

## 2012-08-13 DIAGNOSIS — Z01812 Encounter for preprocedural laboratory examination: Secondary | ICD-10-CM | POA: Insufficient documentation

## 2012-08-13 DIAGNOSIS — Z01818 Encounter for other preprocedural examination: Secondary | ICD-10-CM | POA: Insufficient documentation

## 2012-08-13 DIAGNOSIS — I10 Essential (primary) hypertension: Secondary | ICD-10-CM | POA: Insufficient documentation

## 2012-08-13 HISTORY — DX: Unspecified osteoarthritis, unspecified site: M19.90

## 2012-08-13 HISTORY — DX: Hypothyroidism, unspecified: E03.9

## 2012-08-13 LAB — BASIC METABOLIC PANEL
Calcium: 9.7 mg/dL (ref 8.4–10.5)
GFR calc non Af Amer: 89 mL/min — ABNORMAL LOW (ref >90)
Sodium: 141 mEq/L (ref 135–145)

## 2012-08-13 LAB — CBC
HCT: 39.6 % (ref 36.0–46.0)
Platelets: 260 10*3/uL (ref 150–400)
RDW: 12.4 % (ref 11.5–15.5)
WBC: 4.6 10*3/uL (ref 4.0–10.5)

## 2012-08-13 LAB — SURGICAL PCR SCREEN: Staphylococcus aureus: NEGATIVE

## 2012-08-13 NOTE — Patient Instructions (Signed)
20      Your procedure is scheduled on:  Wednesday 08/15/2012 at 0830 am  Report to [facility] at  0630 AM.  Call this number if you have problems the morning of surgery: [phone]   Remember:   Do not eat food or drink liquids after midnight!  Take these medicines the morning of surgery with A SIP OF WATER: Celexa, Nexium, Synthroid   Do not bring valuables to the hospital.  .  Leave suitcase in the car. After surgery it may be brought to your room.  For patients admitted to the hospital, checkout time is 11:00 AM the day of              Discharge.    Special Instructions: See [facility] Preparing  For Surgery Instruction Sheet.   Do not wear jewelry, lotions powders, perfumes. Women do not shave  legs or underarms for 12 hours before showers. Contacts, partial plates, or dentures may not be worn into surgery.                          Patients discharged the day of surgery will not be allowed to drive home.   If going home the same day of surgery, must have someone stay with you  first 24 hrs.at home and arrange for someone to drive you home from the Hospital.              YOUR DRIVER IS: Eber Jones Dingman-friend   Please read over the following fact sheets that you were given: MRSA INFORMATION,INCENTIVE SPIROMETRY SHEET, SLEEP APNEA SHEET                            Telford Nab.Neida Ellegood,RN,BSN     [phone]

## 2012-08-14 MED ORDER — GENTAMICIN SULFATE 40 MG/ML IJ SOLN
350.0000 mg | Freq: Once | INTRAVENOUS | Status: AC
  Administered 2012-08-15: 350 mg via INTRAVENOUS
  Filled 2012-08-14: qty 8.75

## 2012-08-14 NOTE — Anesthesia Preprocedure Evaluation (Addendum)
Anesthesia Evaluation  Patient identified by MRN, date of birth, ID band Patient awake    Reviewed: Allergy & Precautions, H&P , NPO status , Patient's Chart, lab work & pertinent test results, reviewed documented beta blocker date and time   Airway Mallampati: II TM Distance: >3 FB Neck ROM: full    Dental No notable dental hx.    Pulmonary neg pulmonary ROS,  breath sounds clear to auscultation  Pulmonary exam normal       Cardiovascular Exercise Tolerance: Good hypertension, On Medications negative cardio ROS  Rhythm:regular Rate:Normal     Neuro/Psych negative neurological ROS  negative psych ROS   GI/Hepatic negative GI ROS, Neg liver ROS, GERD-  Medicated,  Endo/Other  negative endocrine ROSHypothyroidism   Renal/GU Renal diseasenegative Renal ROS  negative genitourinary   Musculoskeletal   Abdominal   Peds  Hematology negative hematology ROS (+)   Anesthesia Other Findings   Reproductive/Obstetrics negative OB ROS                          Anesthesia Physical Anesthesia Plan  ASA: II  Anesthesia Plan: General ETT   Post-op Pain Management:    Induction:   Airway Management Planned:   Additional Equipment:   Intra-op Plan:   Post-operative Plan:   Informed Consent: I have reviewed the patients History and Physical, chart, labs and discussed the procedure including the risks, benefits and alternatives for the proposed anesthesia with the patient or authorized representative who has indicated his/her understanding and acceptance.   Dental Advisory Given  Plan Discussed with: CRNA  Anesthesia Plan Comments:         Anesthesia Quick Evaluation

## 2012-08-15 ENCOUNTER — Ambulatory Visit (HOSPITAL_COMMUNITY): Payer: 59

## 2012-08-15 ENCOUNTER — Encounter (HOSPITAL_COMMUNITY)
Admission: RE | Disposition: A | Discharge status: Home / Self Care | Payer: Self-pay | Source: Ambulatory Visit | Attending: Urology

## 2012-08-15 ENCOUNTER — Encounter (HOSPITAL_COMMUNITY): Payer: Self-pay | Admitting: Anesthesiology

## 2012-08-15 ENCOUNTER — Inpatient Hospital Stay (HOSPITAL_COMMUNITY)
Admission: RE | Admit: 2012-08-15 | Discharge: 2012-08-18 | DRG: 661 | Disposition: A | Discharge status: Home / Self Care | Payer: 59 | Source: Ambulatory Visit | Attending: Urology | Admitting: Urology

## 2012-08-15 ENCOUNTER — Encounter (HOSPITAL_COMMUNITY): Payer: Self-pay | Admitting: *Deleted

## 2012-08-15 ENCOUNTER — Ambulatory Visit (HOSPITAL_COMMUNITY): Payer: 59 | Admitting: Anesthesiology

## 2012-08-15 DIAGNOSIS — F411 Generalized anxiety disorder: Secondary | ICD-10-CM | POA: Diagnosis present

## 2012-08-15 DIAGNOSIS — K219 Gastro-esophageal reflux disease without esophagitis: Secondary | ICD-10-CM | POA: Diagnosis present

## 2012-08-15 DIAGNOSIS — E039 Hypothyroidism, unspecified: Secondary | ICD-10-CM | POA: Diagnosis present

## 2012-08-15 DIAGNOSIS — I10 Essential (primary) hypertension: Secondary | ICD-10-CM | POA: Diagnosis present

## 2012-08-15 DIAGNOSIS — N200 Calculus of kidney: Principal | ICD-10-CM | POA: Diagnosis present

## 2012-08-15 DIAGNOSIS — Z79899 Other long term (current) drug therapy: Secondary | ICD-10-CM

## 2012-08-15 HISTORY — PX: CYSTOSCOPY: SHX5120

## 2012-08-15 HISTORY — PX: NEPHROLITHOTOMY: SHX5134

## 2012-08-15 LAB — BASIC METABOLIC PANEL
CO2: 26 mEq/L (ref 19–32)
Chloride: 104 mEq/L (ref 96–112)
Sodium: 138 mEq/L (ref 135–145)

## 2012-08-15 LAB — HEMOGLOBIN AND HEMATOCRIT, BLOOD: HCT: 35.9 % — ABNORMAL LOW (ref 36.0–46.0)

## 2012-08-15 SURGERY — NEPHROLITHOTOMY PERCUTANEOUS
Anesthesia: General | Wound class: Clean Contaminated

## 2012-08-15 MED ORDER — HYDROMORPHONE HCL PF 1 MG/ML IJ SOLN
0.5000 mg | INTRAMUSCULAR | Status: DC | PRN
  Administered 2012-08-15 (×2): 0.5 mg via INTRAVENOUS

## 2012-08-15 MED ORDER — ATORVASTATIN CALCIUM 40 MG PO TABS
40.0000 mg | ORAL_TABLET | Freq: Every day | ORAL | Status: DC
  Administered 2012-08-15 – 2012-08-17 (×3): 40 mg via ORAL
  Filled 2012-08-15 (×4): qty 1

## 2012-08-15 MED ORDER — ONDANSETRON HCL 4 MG/2ML IJ SOLN
INTRAMUSCULAR | Status: DC | PRN
  Administered 2012-08-15: 4 mg via INTRAVENOUS

## 2012-08-15 MED ORDER — DOCUSATE SODIUM 100 MG PO CAPS
100.0000 mg | ORAL_CAPSULE | Freq: Two times a day (BID) | ORAL | Status: DC
  Administered 2012-08-15 – 2012-08-17 (×5): 100 mg via ORAL
  Filled 2012-08-15 (×7): qty 1

## 2012-08-15 MED ORDER — SODIUM CHLORIDE 0.9 % IR SOLN
Status: DC | PRN
  Administered 2012-08-15: 12000 mL

## 2012-08-15 MED ORDER — OXYCODONE HCL 5 MG PO TABS
5.0000 mg | ORAL_TABLET | ORAL | Status: DC | PRN
  Administered 2012-08-16: 5 mg via ORAL
  Filled 2012-08-15 (×2): qty 1

## 2012-08-15 MED ORDER — GLYCOPYRROLATE 0.2 MG/ML IJ SOLN
INTRAMUSCULAR | Status: DC | PRN
  Administered 2012-08-15: 0.6 mg via INTRAVENOUS

## 2012-08-15 MED ORDER — HYDROMORPHONE HCL PF 1 MG/ML IJ SOLN
INTRAMUSCULAR | Status: AC
  Filled 2012-08-15: qty 1

## 2012-08-15 MED ORDER — 0.9 % SODIUM CHLORIDE (POUR BTL) OPTIME
TOPICAL | Status: DC | PRN
  Administered 2012-08-15: 2000 mL

## 2012-08-15 MED ORDER — SODIUM CHLORIDE 0.9 % IR SOLN
Status: DC | PRN
  Administered 2012-08-15: 1000 mL

## 2012-08-15 MED ORDER — SENNA 8.6 MG PO TABS
1.0000 | ORAL_TABLET | Freq: Two times a day (BID) | ORAL | Status: DC
  Administered 2012-08-15 – 2012-08-17 (×5): 8.6 mg via ORAL
  Filled 2012-08-15 (×5): qty 1

## 2012-08-15 MED ORDER — KCL IN DEXTROSE-NACL 20-5-0.45 MEQ/L-%-% IV SOLN
INTRAVENOUS | Status: DC
  Administered 2012-08-15 – 2012-08-17 (×4): via INTRAVENOUS
  Filled 2012-08-15 (×6): qty 1000

## 2012-08-15 MED ORDER — HYDROMORPHONE HCL PF 1 MG/ML IJ SOLN
0.5000 mg | INTRAMUSCULAR | Status: DC | PRN
  Administered 2012-08-15 – 2012-08-17 (×8): 1 mg via INTRAVENOUS
  Filled 2012-08-15 (×8): qty 1

## 2012-08-15 MED ORDER — PROPOFOL 10 MG/ML IV BOLUS
INTRAVENOUS | Status: DC | PRN
  Administered 2012-08-15: 150 mg via INTRAVENOUS

## 2012-08-15 MED ORDER — PANTOPRAZOLE SODIUM 40 MG PO TBEC
40.0000 mg | DELAYED_RELEASE_TABLET | Freq: Every day | ORAL | Status: DC
  Administered 2012-08-16 – 2012-08-17 (×2): 40 mg via ORAL
  Filled 2012-08-15 (×3): qty 1

## 2012-08-15 MED ORDER — ACETAMINOPHEN 10 MG/ML IV SOLN
INTRAVENOUS | Status: DC | PRN
  Administered 2012-08-15: 1000 mg via INTRAVENOUS

## 2012-08-15 MED ORDER — LISINOPRIL 10 MG PO TABS
10.0000 mg | ORAL_TABLET | Freq: Every day | ORAL | Status: DC
  Administered 2012-08-15 – 2012-08-17 (×3): 10 mg via ORAL
  Filled 2012-08-15 (×4): qty 1

## 2012-08-15 MED ORDER — CISATRACURIUM BESYLATE (PF) 10 MG/5ML IV SOLN
INTRAVENOUS | Status: DC | PRN
  Administered 2012-08-15: 3 mg via INTRAVENOUS
  Administered 2012-08-15: 8 mg via INTRAVENOUS
  Administered 2012-08-15: 3 mg via INTRAVENOUS

## 2012-08-15 MED ORDER — NEOSTIGMINE METHYLSULFATE 1 MG/ML IJ SOLN
INTRAMUSCULAR | Status: DC | PRN
  Administered 2012-08-15: 4 mg via INTRAVENOUS

## 2012-08-15 MED ORDER — PROMETHAZINE HCL 25 MG/ML IJ SOLN: 6.2500 mg | INTRAMUSCULAR | Status: DC | PRN

## 2012-08-15 MED ORDER — MIDAZOLAM HCL 5 MG/5ML IJ SOLN
INTRAMUSCULAR | Status: DC | PRN
  Administered 2012-08-15: 2 mg via INTRAVENOUS

## 2012-08-15 MED ORDER — IOHEXOL 300 MG/ML  SOLN
INTRAMUSCULAR | Status: AC
  Filled 2012-08-15: qty 1

## 2012-08-15 MED ORDER — ONDANSETRON HCL 4 MG/2ML IJ SOLN
4.0000 mg | INTRAMUSCULAR | Status: DC | PRN
  Administered 2012-08-15: 4 mg via INTRAVENOUS
  Filled 2012-08-15: qty 2

## 2012-08-15 MED ORDER — EPHEDRINE SULFATE 50 MG/ML IJ SOLN
INTRAMUSCULAR | Status: DC | PRN
  Administered 2012-08-15: 7.5 mg via INTRAVENOUS

## 2012-08-15 MED ORDER — HYDROMORPHONE HCL PF 1 MG/ML IJ SOLN
0.2500 mg | INTRAMUSCULAR | Status: DC | PRN
  Administered 2012-08-15 (×4): 0.5 mg via INTRAVENOUS

## 2012-08-15 MED ORDER — CIPROFLOXACIN IN D5W 400 MG/200ML IV SOLN
INTRAVENOUS | Status: AC
  Filled 2012-08-15: qty 200

## 2012-08-15 MED ORDER — CITALOPRAM HYDROBROMIDE 40 MG PO TABS
40.0000 mg | ORAL_TABLET | Freq: Every morning | ORAL | Status: DC
  Administered 2012-08-16 – 2012-08-17 (×2): 40 mg via ORAL
  Filled 2012-08-15 (×3): qty 1

## 2012-08-15 MED ORDER — ACETAMINOPHEN 10 MG/ML IV SOLN
1000.0000 mg | Freq: Four times a day (QID) | INTRAVENOUS | Status: AC
  Administered 2012-08-15 – 2012-08-16 (×4): 1000 mg via INTRAVENOUS
  Filled 2012-08-15 (×5): qty 100

## 2012-08-15 MED ORDER — LACTATED RINGERS IV SOLN
INTRAVENOUS | Status: DC | PRN
  Administered 2012-08-15 (×3): via INTRAVENOUS

## 2012-08-15 MED ORDER — ACETAMINOPHEN 10 MG/ML IV SOLN
INTRAVENOUS | Status: AC
  Filled 2012-08-15: qty 100

## 2012-08-15 MED ORDER — LEVOTHYROXINE SODIUM 50 MCG PO TABS
50.0000 ug | ORAL_TABLET | Freq: Every day | ORAL | Status: DC
  Administered 2012-08-16 – 2012-08-18 (×3): 50 ug via ORAL
  Filled 2012-08-15 (×4): qty 1

## 2012-08-15 MED ORDER — IOHEXOL 300 MG/ML  SOLN
INTRAMUSCULAR | Status: AC
  Filled 2012-08-15: qty 2

## 2012-08-15 MED ORDER — FENTANYL CITRATE 0.05 MG/ML IJ SOLN
INTRAMUSCULAR | Status: DC | PRN
  Administered 2012-08-15 (×2): 50 ug via INTRAVENOUS
  Administered 2012-08-15: 100 ug via INTRAVENOUS
  Administered 2012-08-15: 50 ug via INTRAVENOUS

## 2012-08-15 MED ORDER — IOHEXOL 300 MG/ML  SOLN
INTRAMUSCULAR | Status: DC | PRN
  Administered 2012-08-15: 200 mL via INTRAVENOUS

## 2012-08-15 SURGICAL SUPPLY — 60 items
APL SKNCLS STERI-STRIP NONHPOA (GAUZE/BANDAGES/DRESSINGS) ×2
BAG URINE DRAINAGE (UROLOGICAL SUPPLIES) ×3 IMPLANT
BAG URO CATCHER STRL LF (DRAPE) ×3 IMPLANT
BASKET ZERO TIP NITINOL 2.4FR (BASKET) ×3 IMPLANT
BENZOIN TINCTURE PRP APPL 2/3 (GAUZE/BANDAGES/DRESSINGS) ×5 IMPLANT
BLADE SURG 15 STRL LF DISP TIS (BLADE) ×2 IMPLANT
BLADE SURG 15 STRL SS (BLADE) ×3
BSKT STON RTRVL ZERO TP 2.4FR (BASKET) ×2
CATCHER STONE W/TUBE ADAPTER (UROLOGICAL SUPPLIES) ×2 IMPLANT
CATH BEACON 5.038 65CM KMP-01 (CATHETERS) ×1 IMPLANT
CATH FOLEY 2W COUNCIL 20FR 5CC (CATHETERS) IMPLANT
CATH INTERMIT  6FR 70CM (CATHETERS) ×1 IMPLANT
CATH ROBINSON RED A/P 20FR (CATHETERS) IMPLANT
CATH ULTRATHANE 14FR (STENTS) ×1 IMPLANT
CATH X-FORCE N30 NEPHROSTOMY (TUBING) ×3 IMPLANT
CLOTH BEACON ORANGE TIMEOUT ST (SAFETY) ×3 IMPLANT
COVER SURGICAL LIGHT HANDLE (MISCELLANEOUS) ×3 IMPLANT
DRAPE C-ARM 42X72 X-RAY (DRAPES) ×3 IMPLANT
DRAPE CAMERA CLOSED 9X96 (DRAPES) ×4 IMPLANT
DRAPE LINGEMAN PERC (DRAPES) ×3 IMPLANT
DRAPE SURG IRRIG POUCH 19X23 (DRAPES) ×3 IMPLANT
DRSG PAD ABDOMINAL 8X10 ST (GAUZE/BANDAGES/DRESSINGS) ×1 IMPLANT
DRSG TEGADERM 8X12 (GAUZE/BANDAGES/DRESSINGS) ×5 IMPLANT
GLOVE BIOGEL M 8.0 STRL (GLOVE) ×2 IMPLANT
GLOVE BIOGEL M STRL SZ7.5 (GLOVE) ×3 IMPLANT
GOWN STRL NON-REIN LRG LVL3 (GOWN DISPOSABLE) ×5 IMPLANT
GOWN STRL REIN XL XLG (GOWN DISPOSABLE) ×4 IMPLANT
GUIDEWIRE AMPLAZ .035X145 (WIRE) ×2 IMPLANT
GUIDEWIRE ANG ZIPWIRE 038X150 (WIRE) ×1 IMPLANT
KIT BASIN OR (CUSTOM PROCEDURE TRAY) ×3 IMPLANT
LASER FIBER DISP (UROLOGICAL SUPPLIES) IMPLANT
LASER FIBER DISP 1000U (UROLOGICAL SUPPLIES) IMPLANT
MANIFOLD NEPTUNE II (INSTRUMENTS) ×4 IMPLANT
NDL TROCAR 18X15 ECHO (NEEDLE) IMPLANT
NEEDLE TROCAR 18X15 ECHO (NEEDLE) ×3 IMPLANT
NS IRRIG 1000ML POUR BTL (IV SOLUTION) ×2 IMPLANT
PACK BASIC VI WITH GOWN DISP (CUSTOM PROCEDURE TRAY) ×3 IMPLANT
PACK CYSTO (CUSTOM PROCEDURE TRAY) ×3 IMPLANT
PAD ABD 7.5X8 STRL (GAUZE/BANDAGES/DRESSINGS) ×6 IMPLANT
PROBE LITHOCLAST ULTRA 3.8X403 (UROLOGICAL SUPPLIES) ×1 IMPLANT
PROBE PNEUMATIC 1.0MMX570MM (UROLOGICAL SUPPLIES) ×3 IMPLANT
SET IRRIG Y TYPE TUR BLADDER L (SET/KITS/TRAYS/PACK) ×3 IMPLANT
SET WARMING FLUID IRRIGATION (MISCELLANEOUS) ×2 IMPLANT
SHEATH PEELAWAY SET 9 (SHEATH) ×1 IMPLANT
SPONGE GAUZE 4X4 12PLY (GAUZE/BANDAGES/DRESSINGS) ×3 IMPLANT
SPONGE LAP 4X18 X RAY DECT (DISPOSABLE) ×3 IMPLANT
STONE CATCHER W/TUBE ADAPTER (UROLOGICAL SUPPLIES) ×3 IMPLANT
SUT SILK 0 (SUTURE) ×3
SUT SILK 0 30XBRD TIE 6 (SUTURE) IMPLANT
SUT SILK 2 0 30  PSL (SUTURE) ×1
SUT SILK 2 0 30 PSL (SUTURE) ×2 IMPLANT
SYR 20CC LL (SYRINGE) ×4 IMPLANT
SYR 50ML LL SCALE MARK (SYRINGE) ×1 IMPLANT
SYRINGE 10CC LL (SYRINGE) ×3 IMPLANT
TOWEL NATURAL 10PK STERILE (DISPOSABLE) ×1 IMPLANT
TRAY FOLEY BAG SILVER LF 14FR (CATHETERS) ×2 IMPLANT
TRAY FOLEY CATH 14FRSI W/METER (CATHETERS) ×3 IMPLANT
TUBE CONNECTING VINYL 14FR 30C (MISCELLANEOUS) ×1 IMPLANT
TUBING CONNECTING 10 (TUBING) ×9 IMPLANT
WATER STERILE IRR 1500ML POUR (IV SOLUTION) ×2 IMPLANT

## 2012-08-15 NOTE — Addendum Note (Signed)
Addendum  created 08/15/12 1237 by Elesa Massed, CRNA   Modules edited:Anesthesia Flowsheet

## 2012-08-15 NOTE — Transfer of Care (Signed)
Immediate Anesthesia Transfer of Care Note  Patient: Marie Dominguez  Procedure(s) Performed: Procedure(s) (LRB) with comments: NEPHROLITHOTOMY PERCUTANEOUS (Left) - WITH SURGEON ACCESS  CYSTOSCOPY (N/A)  Patient Location: PACU  Anesthesia Type:General  Level of Consciousness: awake, sedated and patient cooperative  Airway & Oxygen Therapy: Patient Spontanous Breathing and Patient connected to face mask oxygen  Post-op Assessment: Report given to PACU RN and Post -op Vital signs reviewed and stable  Post vital signs: Reviewed and stable  Complications: No apparent anesthesia complications

## 2012-08-15 NOTE — Brief Op Note (Signed)
08/15/2012  11:32 AM  PATIENT:  Marie Dominguez  61 y.o. female  PRE-OPERATIVE DIAGNOSIS:  LEFT STAGHORN RENAL CALCULUS  POST-OPERATIVE DIAGNOSIS:  LEFT STAGHORN RENAL CALCULUS  PROCEDURE:  Procedure(s) (LRB) with comments: NEPHROLITHOTOMY PERCUTANEOUS (Left) - WITH SURGEON ACCESS  CYSTOSCOPY (N/A), URETEROSCOPY, NEPHROTOMY TUBE PLACEMENT, ANTEROGRADE NEPHROSTOGRAM WITH INTERPRETATION, RETROGRADE PYElOGRAM WITH INTERPRETATION  SURGEON:  Surgeon(s) and Role:    * Sebastian Ache, MD - Primary  PHYSICIAN ASSISTANT:   ASSISTANTS: none   ANESTHESIA:   general  EBL:  Total I/O In: 2000 [I.V.:2000] Out: -   BLOOD ADMINISTERED:none  DRAINS:  1 - Left nephrostomy to straight drain, 2 - Left nephroureteral stent, capped, 3 - Foley to straight drain.  LOCAL MEDICATIONS USED:  NONE  SPECIMEN:  Source of Specimen:  left kidney stone  DISPOSITION OF SPECIMEN:  [facility] for Compositional Analysis  COUNTS:  YES  TOURNIQUET:  * No tourniquets in log *  DICTATION: .Other Dictation: Dictation Number [id number]  PLAN OF CARE: Admit to inpatient   PATIENT DISPOSITION:  PACU - hemodynamically stable.   Delay start of Pharmacological VTE agent (>24hrs) due to surgical blood loss or risk of bleeding: yes

## 2012-08-15 NOTE — Care Management Note (Unsigned)
    Page 1 of 1   08/15/2012     4:47:21 PM   CARE MANAGEMENT NOTE 08/15/2012  Patient:  Marie Dominguez, Marie Dominguez   Account Number:  [id number]  Date Initiated:  08/15/2012  Documentation initiated by:  Lanier Clam  Subjective/Objective Assessment:   ADMITTED W/L RENAL CALCULUS.     Action/Plan:   FROM HOME   Anticipated DC Date:  08/16/2012   Anticipated DC Plan:  HOME/SELF CARE      DC Planning Services  CM consult      Choice offered to / List presented to:             Status of service:  In process, will continue to follow Medicare Important Message given?   (If response is "NO", the following Medicare IM given date fields will be blank) Date Medicare IM given:   Date Additional Medicare IM given:    Discharge Disposition:    Per UR Regulation:  Reviewed for med. necessity/level of care/duration of stay  If discussed at Long Length of Stay Meetings, dates discussed:    Comments:  08/15/12 Kayden Hutmacher RN,BSN NCM [phone] S/P L NEPHROLITHOTOMY PERCUTANEOUS.

## 2012-08-15 NOTE — Anesthesia Postprocedure Evaluation (Signed)
  Anesthesia Post-op Note  Patient: Marie Dominguez  Procedure(s) Performed: Procedure(s) (LRB): NEPHROLITHOTOMY PERCUTANEOUS (Left) CYSTOSCOPY (N/A)  Patient Location: PACU  Anesthesia Type: General  Level of Consciousness: awake and alert   Airway and Oxygen Therapy: Patient Spontanous Breathing  Post-op Pain: mild  Post-op Assessment: Post-op Vital signs reviewed, Patient's Cardiovascular Status Stable, Respiratory Function Stable, Patent Airway and No signs of Nausea or vomiting  Post-op Vital Signs: stable  Complications: No apparent anesthesia complications

## 2012-08-15 NOTE — H&P (Signed)
Marie Dominguez is an 61 y.o. female.   Chief Complaint: Pre-OP Left Percutaneous Nephrostolithotomy HPI:    1 - Left Partial Staghorn Kidney Stone - Left lower-mid partial stagorn approx 2cm found on w/u left flank pain at [facility] ER 05/2012. No piror stone events. UCX from that visit and in [place] office negative. Cr 1.15. Renogram with 45% left function and no obstruction.  PMH sig for hyst (benign tumor), HTN, Hypothyroid, Anxiety. No CV disease. No strong blood thinners.  Today Marie Dominguez is seen for left percutaneous nephrostolithotomy. 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 No interval fevers. She began Bactrim 3 days ago as instructed.   Past Medical History  Diagnosis Date  . Thyroid disease   . Hypertension   . Acid reflux   . Kidney stones   . Hypothyroidism   . Arthritis     Past Surgical History  Procedure Date  . Femur closed reduction   . Shoulder surgery   . Abdominal hysterectomy     Family History  Problem Relation Age of Onset  . Cancer Other    Social History:  reports that she has never smoked. She quit smokeless tobacco use about 10 years ago. She reports that she drinks about .6 ounces of alcohol per week. She reports that she does not use illicit drugs.  Allergies: No Known Allergies  Medications Prior to Admission  Medication Sig Dispense Refill  . citalopram (CELEXA) 40 MG tablet Take 40 mg by mouth every morning.      Marland Kitchen esomeprazole (NEXIUM) 40 MG capsule Take 40 mg by mouth daily before breakfast.      . fish oil-omega-3 fatty acids 1000 MG capsule Take 2 g by mouth 2 (two) times daily.      Marland Kitchen glucosamine-chondroitin 500-400 MG tablet Take 2 tablets by mouth daily.       Marland Kitchen HYDROcodone-acetaminophen (NORCO/VICODIN) 5-325 MG per tablet 1-2 tablets po q 6 hours prn moderate to severe pain  20 tablet  0  . levothyroxine (SYNTHROID, LEVOTHROID) 50 MCG tablet Take 50 mcg by mouth every morning.      Marland Kitchen lisinopril (PRINIVIL,ZESTRIL) 10 MG tablet  Take 10 mg by mouth daily.      . Multiple Vitamins-Minerals (MULTIVITAMIN WITH MINERALS) tablet Take 1 tablet by mouth daily.      . rosuvastatin (CRESTOR) 20 MG tablet Take 20 mg by mouth at bedtime.       . Vitamin D, Ergocalciferol, (DRISDOL) 50000 UNITS CAPS Take 50,000 Units by mouth every Thursday.         Results for orders placed during the hospital encounter of 08/13/12 (from the past 48 hour(s))  SURGICAL PCR SCREEN     Status: Normal   Collection Time   08/13/12  9:09 AM      Component Value Range Comment   MRSA, PCR NEGATIVE  NEGATIVE    Staphylococcus aureus NEGATIVE  NEGATIVE   CBC     Status: Normal   Collection Time   08/13/12  9:20 AM      Component Value Range Comment   WBC 4.6  4.0 - 10.5 K/uL    RBC 4.61  3.87 - 5.11 MIL/uL    Hemoglobin 13.7  12.0 - 15.0 g/dL    HCT 16.1  09.6 - 04.5 %    MCV 85.9  78.0 - 100.0 fL    MCH 29.7  26.0 - 34.0 pg    MCHC 34.6  30.0 - 36.0 g/dL  RDW 12.4  11.5 - 15.5 %    Platelets 260  150 - 400 K/uL   BASIC METABOLIC PANEL     Status: Abnormal   Collection Time   08/13/12  9:20 AM      Component Value Range Comment   Sodium 141  135 - 145 mEq/L    Potassium 3.9  3.5 - 5.1 mEq/L    Chloride 104  96 - 112 mEq/L    CO2 25  19 - 32 mEq/L    Glucose, Bld 87  70 - 99 mg/dL    BUN 14  6 - 23 mg/dL    Creatinine, Ser 5.78  0.50 - 1.10 mg/dL    Calcium 9.7  8.4 - 46.9 mg/dL    GFR calc non Af Amer 89 (*) >90 mL/min    GFR calc Af Amer >90  >90 mL/min    Dg Chest 2 View  08/13/2012  *RADIOLOGY REPORT*  Clinical Data: 61 year old female preoperative study for percutaneous nephrolithotomy.  Hypertension.  CHEST - 2 VIEW  Comparison: CT abdomen 06/17/2012.  Findings: Mild eventration of the diaphragm.  Lung volumes are within normal limits.   Cardiac size and mediastinal contours are within normal limits.  Visualized tracheal air column is within normal limits.  Moderate apical scarring.  No pneumothorax, pulmonary edema, pleural  effusion or consolidation. No acute osseous abnormality identified.  IMPRESSION: No acute cardiopulmonary abnormality.   Original Report Authenticated By: Erskine Speed, M.D.     Review of Systems  Constitutional: Negative.  Negative for fever and chills.  HENT: Negative.   Eyes: Negative.   Respiratory: Negative.   Cardiovascular: Negative.   Gastrointestinal: Negative.   Genitourinary: Negative.   Musculoskeletal: Negative.   Skin: Negative.   Neurological: Negative.   Endo/Heme/Allergies: Negative.   Psychiatric/Behavioral: Negative.     Blood pressure 140/85, temperature 98.8 F (37.1 C), temperature source Oral, resp. rate 18, SpO2 100.00%. Physical Exam  Constitutional: She is oriented to person, place, and time. She appears well-developed and well-nourished.  HENT:  Head: Normocephalic and atraumatic.  Eyes: EOM are normal. Pupils are equal, round, and reactive to light.  Neck: Normal range of motion. Neck supple.  Cardiovascular: Normal rate and regular rhythm.   Respiratory: Effort normal and breath sounds normal.  GI: Soft. Bowel sounds are normal.  Genitourinary:       No CVAT  Musculoskeletal: Normal range of motion.  Neurological: She is alert and oriented to person, place, and time.  Skin: Skin is warm and dry.  Psychiatric: She has a normal mood and affect. Her behavior is normal. Judgment and thought content normal.     Assessment/Plan 1- Left Partial Staghorn Kidney Stone -  We discussed percutaneous nephrostolithotomy (PCNL) in detail including need for percutaneous access which is sometimes gained by the surgeon, and other times by radiology or through existing nephrostomy tubes if present. We specifically discussed that often times tubes remain in after surgery until we are confident all stone has been treated. We mentioned that staged surgery is needed in over 50% of cases of very large or complex stone. We then discussed general risks including bleeding,  infection, damage to kidney / ureter / bladder, loss of kidney, as well as anesthetic.  Proceed with surgery today.  Marie Dominguez 08/15/2012, 6:40 AM

## 2012-08-16 ENCOUNTER — Ambulatory Visit (HOSPITAL_COMMUNITY): Payer: 59

## 2012-08-16 LAB — BASIC METABOLIC PANEL
BUN: 11 mg/dL (ref 6–23)
Chloride: 101 mEq/L (ref 96–112)
Creatinine, Ser: 0.74 mg/dL (ref 0.50–1.10)
GFR calc Af Amer: >90 mL/min (ref >90)
GFR calc non Af Amer: 89 mL/min — ABNORMAL LOW (ref >90)
Potassium: 4.1 mEq/L (ref 3.5–5.1)

## 2012-08-16 LAB — HEMOGLOBIN AND HEMATOCRIT, BLOOD
HCT: 33.1 % — ABNORMAL LOW (ref 36.0–46.0)
Hemoglobin: 11.1 g/dL — ABNORMAL LOW (ref 12.0–15.0)

## 2012-08-16 NOTE — Progress Notes (Signed)
Pt declined removal of foley, will plan to leave in for tomorrow's procedure. Pt is aware there are orders to remove foley if she decides she wants it out. Annalisse Minkoff, Lavone Orn, RN

## 2012-08-16 NOTE — Progress Notes (Signed)
1 Day Post-Op  Subjective:  1 - Left Partial Staghorn Renal Stone - s/p left 1st stage PCNL 08/15/2012. No events overnight. Pain controlled. Tolerating diet.   CT this AM with two small residual fragments, one lower and one mid pole.  Objective: Vital signs in last 24 hours: Temp:  [97.7 F (36.5 C)-99.6 F (37.6 C)] 99 F (37.2 C) (11/21 1025) Pulse Rate:  [58-81] 74  (11/21 1025) Resp:  [18-20] 20  (11/21 1025) BP: (100-111)/(52-61) 103/52 mmHg (11/21 1025) SpO2:  [92 %-100 %] 95 % (11/21 1025) Last BM Date: 08/14/12  Intake/Output from previous day: 11/20 0701 - 11/21 0700 In: 4342.5 [P.O.:100; I.V.:3942.5; IV Piggyback:300] Out: 2325 [Urine:2325] Intake/Output this shift: Total I/O In: 240 [P.O.:240] Out: 1250 [Urine:1250]  General appearance: alert, cooperative and appears stated age Head: Normocephalic, without obvious abnormality, atraumatic Eyes: conjunctivae/corneas clear. PERRL, EOM's intact. Fundi benign. Ears: normal TM's and external ear canals both ears Nose: Nares normal. Septum midline. Mucosa normal. No drainage or sinus tenderness. Throat: lips, mucosa, and tongue normal; teeth and gums normal Neck: no adenopathy, no carotid bruit, no JVD, supple, symmetrical, trachea midline and thyroid not enlarged, symmetric, no tenderness/mass/nodules Back: symmetric, no curvature. ROM normal. No CVA tenderness., Left PCNL site with some mild peri-tube leakage as expected. Pink urine from neph tube.  Resp: clear to auscultation bilaterally Chest wall: no tenderness Cardio: regular rate and rhythm, S1, S2 normal, no murmur, click, rub or gallop GI: soft, non-tender; bowel sounds normal; no masses,  no organomegaly Extremities: extremities normal, atraumatic, no cyanosis or edema Pulses: 2+ and symmetric Skin: Skin color, texture, turgor normal. No rashes or lesions Lymph nodes: Cervical, supraclavicular, and axillary nodes normal. Neurologic: Grossly  normal Incision/Wound:  Lab Results:   Basename 08/16/12 0435 08/15/12 1207  WBC -- --  HGB 11.1* 12.2  HCT 33.1* 35.9*  PLT -- --   BMET  Basename 08/16/12 0435 08/15/12 1207  NA 136 138  K 4.1 4.0  CL 101 104  CO2 27 26  GLUCOSE 125* 129*  BUN 11 17  CREATININE 0.74 0.78  CALCIUM 8.6 8.6   PT/INR No results found for this basename: LABPROT:2,INR:2 in the last 72 hours ABG No results found for this basename: PHART:2,PCO2:2,PO2:2,HCO3:2 in the last 72 hours  Studies/Results: Ct Abdomen Pelvis Wo Contrast  08/16/2012  *RADIOLOGY REPORT*  Clinical Data: Post left nephrolithotomy.  Evaluate residual stone burden  CT ABDOMEN AND PELVIS WITHOUT CONTRAST  Technique:  Multidetector CT imaging of the abdomen and pelvis was performed following the standard protocol without intravenous contrast.  Comparison: CT 06/17/2012  Findings: Left percutaneous nephrostomy tube is in good position in the renal pelvis.  Left ureteral stent is in good position. Previously  identified stone in the left lower pole is smaller compared with the prior study.  This stone is in the posterior lateral calix of the left lower pole and measures 4.0 mm compared with 11 x 13 mm previously.  No other stones on the left side and no hydronephrosis.  There is a 2 mm nonobstructing stone right lower pole which is unchanged.  Mild atelectasis in the lung bases.  Liver spleen and pancreas are normal.  Slight layering density in the gallbladder may represent vicarious excretion of contrast versus tiny stones.  This was not present previously and therefore is most likely contrast.  Adynamic ileus with dilatation of large and small bowel.  Appendix is normal.  Bladder is decompressed with a Foley catheter.  No  free fluid in the pelvis.  IMPRESSION: Left percutaneous nephrostomy good position.  Left ureteral stent in good position.  4 mm left lower pole calculus remains.  Tiny nonobstructing 2 mm right lower pole calculus is  unchanged.   Original Report Authenticated By: Janeece Riggers, M.D.    Dg Abd 1 View  08/15/2012  *RADIOLOGY REPORT*  Clinical Data: Left staghorn renal calculus.  DG C-ARM 61-120 MIN - NRPT [facility],RETROGRADE PYELOGRAM,ABDOMEN - 1 VIEW  Comparison: CT abdomen pelvis 06/17/2012.  Findings: Multiple C-arm films demonstrate retrograde pyelogram which opacifies the left ureter and left collecting system. There is indwelling left nephro ureteral stent.  Subsequent c-arm films document left nephrostomy tube placement and nephrostomy tube injection which confirms satisfactory nephrostomy tube placement. Filling defects within the left collecting system represent staghorn calculus.  IMPRESSION: As above.   Original Report Authenticated By: Davonna Belling, M.D.    Dg Retrograde Pyelogram  08/15/2012  *RADIOLOGY REPORT*  Clinical Data: Left staghorn renal calculus.  DG C-ARM 61-120 MIN - NRPT [facility],RETROGRADE PYELOGRAM,ABDOMEN - 1 VIEW  Comparison: CT abdomen pelvis 06/17/2012.  Findings: Multiple C-arm films demonstrate retrograde pyelogram which opacifies the left ureter and left collecting system. There is indwelling left nephro ureteral stent.  Subsequent c-arm films document left nephrostomy tube placement and nephrostomy tube injection which confirms satisfactory nephrostomy tube placement. Filling defects within the left collecting system represent staghorn calculus.  IMPRESSION: As above.   Original Report Authenticated By: Davonna Belling, M.D.    Dg C-arm 61-120 Min-no Report  08/15/2012  *RADIOLOGY REPORT*  Clinical Data: Left staghorn renal calculus.  DG C-ARM 61-120 MIN - NRPT [facility],RETROGRADE PYELOGRAM,ABDOMEN - 1 VIEW  Comparison: CT abdomen pelvis 06/17/2012.  Findings: Multiple C-arm films demonstrate retrograde pyelogram which opacifies the left ureter and left collecting system. There is indwelling left nephro ureteral stent.  Subsequent c-arm films document left nephrostomy tube placement and nephrostomy  tube injection which confirms satisfactory nephrostomy tube placement. Filling defects within the left collecting system represent staghorn calculus.  IMPRESSION: As above.   Original Report Authenticated By: Davonna Belling, M.D.     Anti-infectives: Anti-infectives     Start     Dose/Rate Route Frequency Ordered Stop   08/15/12 0000   gentamicin (GARAMYCIN) 350 mg in dextrose 5 % 100 mL IVPB        350 mg 108.8 mL/hr over 60 Minutes Intravenous  Once 08/14/12 2016 08/15/12 0824          Assessment/Plan:  1 - Left Partial Staghorn Renal Stone - Some small residual stone fragments. Discussed options of observation v. Second stage procedure. Pt wants to proceed with second stage. Will add on for tomorrow. NPO after breakfast. DC foley in interim per pt request.  Berneice Heinrich, Kaid Seeberger 08/16/2012

## 2012-08-16 NOTE — Progress Notes (Signed)
Pt's left flank dressing saturated, leaking.  Dressing reinforced as per order. Will continue to monitor. Newman Nip [place]

## 2012-08-16 NOTE — Op Note (Signed)
Marie Dominguez, Marie Dominguez NO.:  [id number]  MEDICAL RECORD NO.:  [id number]  LOCATION:  1413                         FACILITY:  [facility]  PHYSICIAN:  Sebastian Ache, MD     DATE OF BIRTH:  Dec 10, 1950  DATE OF PROCEDURE:  08/15/2012 DATE OF DISCHARGE:                              OPERATIVE REPORT   DIAGNOSIS:  Left partial staghorn kidney stone.  PROCEDURES: 1. Cystoscopy with left retrograde pyelogram and interpretation. 2. Placement of left ureteral stent. 3. Percutaneous needle access to the left kidney. 4. Left antegrade nephrostogram with interpretation. 5. Left first stage percutaneous nephrostolithotomy stone 2.5 cm. 6. Left diagnostic ureteroscopy. 7. Placement of left nephrostomy tube.  ESTIMATED BLOOD LOSS:  100 mL.  COMPLICATIONS:  None.  FINDINGS: 1. Single large lower pole partial staghorn stone obstructing     the lower pole calyx. 2. Several other small scattered lower pole stones, total volume     approximately 2.5 cm. 3. Otherwise unremarkable retrograde pyelogram. 4. Otherwise unremarkable left antegrade nephrostogram.  DRAINS: 1. Foley catheter straight drain. 2. Left 14-French nephrostomy tube straight drain. 3. Left nephroureteral stent capped.  SPECIMEN:  Left renal stone, set aside for compositional analysis.  INDICATION:  Ms. Arango is a very pleasant 61 year old lady who is on workup of left flank pain to her left partial staghorn stone.  Urine culture was negative and her pain was controlled.  Options were discussed for definitive management including observation versus shock lithotripsy versus staged ureteroscopy versus percutaneous approach with percutaneous approach being the most favored in cases of large stone and complex stones and he wished to proceed.  Informed consent was then placed in medical record.  PROCEDURE IN DETAIL:  The patient being Marie Dominguez, was verified. Procedure being left percutaneous nephrostolithotomy  was confirmed. Procedure was carried out.  Time-out was performed.  Intravenous antibiotics were administered.  General endotracheal anesthesia was introduced.  The patient initially placed into a low lithotomy position. Sterile field was created by prepping and draping the patient's vagina, introitus, and proximal thighs using iodine x3.  Next cystourethroscopy was performed using a 22-French rigid cystoscope with 30-degrees lens. Inspection of urinary bladder revealed no diverticula, calcifications, papular lesions.  Ureteral orifices were in their normal anatomic position.  Next, the left ureteral orifice was gently cannulated using 6- French angled catheter and left retrograde pyelogram was obtained.  Left retrograde pyelogram demonstrated a single left ureter, single system left kidney.  There was a filling defect in the lower pole consistent with known stone.  A 6-French angled catheter was then directed under continuous fluoroscopy to the level of the renal pelvis. A new Foley catheter was placed per urethra to straight drain.  The end hole was fashioned to this along with approximately 5 feet piece of IV extension tubing which was primed with contrast and capped.  Next, the patient was transferred to the second operative table in prone position applying prone view, sequential compression devices, padding of the knees, chest rolls, axillary support and a sterile field was created by prepping and draping the patient's entire left flank, left buttocks, lower chest using chlorhexidine gluconate.  We previously applied extension  tubing connected to the left ureteral stent which was also prepped into the field.  Using retrograde filling of the kidney with contrast for navigation and continuous fluoroscopy, a bulls eye technique was used to access a suitable posterior midpole calyx, level below the 12th rib using blue dye technique.  Vigorous efflux of urine was noted. Attempt was made  to pass a 0.038 Glidewire down the lower ureter, however, this was not successful.  Due to angulation, a separate stick was performed into the lower pole calyx.  Again vigorous urine was seen. The Glidewire was easily able to coil in this lower pole of calyx, however, it was not able to be navigated out of this, suggestive that it was possibly obstructed from stone.  A third and final attempt was then performed using bulls eye technique under continuous fluoroscopy into previous mid pole calyx.  Again, vigorous urine return was noted.  At this time,  the angled tip Glidewire was easily navigated down the level of the ureter confirming excellent through and through access.  This was exchanged via an angle-tipped catheter to a super stiff wire.  Next, a coaxial introducer was placed under continuous fluoroscopy to the level of the proximal ureter.  A separate Glidewire was advanced and it was then exchanged for a second superstiff wire using the angle tipped catheter again.  Percutaneous drape was then applied.  An incision approximately 1.5 cm was then made at level of the skin.  Fascia was dilated using a hemostat.  Next, the 30-French balloon dilation apparatus was carefully positioned with the distal most aspect being within the renal pelvis.  This was inflated to a pressure of 18 atmospheres, held for 90 seconds and a 30-French sheath was very carefully placed into this same location using continuous fluoroscopy. Rigid nephroscopy was then performed.  This revealed excellent placement of the access sheath in direct apposition to the upper portion of the partial staghorn stone.  Next, lithoclast dual ultrasound pneumatic energy was applied to the stone, removing approximately 75% of volume. Next, flexible nephroscopy was performed using a 16-French cystoscope and each calyx was inspected.  There were some residual stone fragments in the lower pole.  These were grasped with a 0 tip  Nitinol basket and brought out in their entirety.  Repeat systematic inspection of all calices revealed no residual stone material.  A separate 0.038 Glidewire was advanced down to the level of ureter over which a 6-French flexible ureteroscope was passed under continuous fluoroscopy to the level of bladder.  Ureteroscopic examination of the entire left ureter revealed no mucosal abnormality or residual calcifications.  I felt that we had achieved the goals in today's procedure and removed all visible stone; however, given the partial staghorn nature, we elected to leave nephrostomy tubes in place for possible second stage procedure if residual stone is confirmed on followup x-ray to be performed tomorrow. As such, a Glidewire was advanced to the level of the upper pole over which a new 14-French nephrostomy tube was carefully placed and deployed under continuous fluoroscopy.  The angled tip Glidewire was advanced over the superstiff safety wire to the level of urinary bladder acting as a nephroureteral stent.  All wires were then removed.  Final antegrade nephrostogram was then obtained.  Final left antegrade nephrostogram demonstrated excellent placement of the nephrostomy tube.  No excessive extravasation and excellent placement of nephroureteral stent and no filling defects.  As such, these were sutured in place using interrupted silk at level of  skin. The nephroureteral stent was capped.  The nephrostomy tube was connected to straight drain.  Percutaneous dressing was applied. Procedure was then terminated.  The patient tolerated the procedure well.  There were no immediate periprocedural complications.  The patient was taken to postanesthesia care unit in stable condition.          ______________________________ Sebastian Ache, MD     TM/MEDQ  D:  08/15/2012  T:  08/16/2012  Job:  [id number]

## 2012-08-17 ENCOUNTER — Inpatient Hospital Stay (HOSPITAL_COMMUNITY): Payer: 59

## 2012-08-17 ENCOUNTER — Encounter (HOSPITAL_COMMUNITY): Payer: Self-pay | Admitting: Urology

## 2012-08-17 ENCOUNTER — Encounter (HOSPITAL_COMMUNITY)
Admission: RE | Disposition: A | Discharge status: Home / Self Care | Payer: Self-pay | Source: Ambulatory Visit | Attending: Urology

## 2012-08-17 ENCOUNTER — Inpatient Hospital Stay (HOSPITAL_COMMUNITY): Payer: 59 | Admitting: Certified Registered Nurse Anesthetist

## 2012-08-17 ENCOUNTER — Encounter (HOSPITAL_COMMUNITY): Payer: Self-pay | Admitting: Certified Registered Nurse Anesthetist

## 2012-08-17 HISTORY — PX: NEPHROLITHOTOMY: SHX5134

## 2012-08-17 SURGERY — NEPHROLITHOTOMY PERCUTANEOUS SECOND LOOK
Anesthesia: General | Site: Flank | Laterality: Left | Wound class: Clean Contaminated

## 2012-08-17 MED ORDER — SUFENTANIL CITRATE 50 MCG/ML IV SOLN
INTRAVENOUS | Status: DC | PRN
  Administered 2012-08-17 (×3): 5 ug via INTRAVENOUS

## 2012-08-17 MED ORDER — SULFAMETHOXAZOLE-TRIMETHOPRIM 400-80 MG PO TABS: 1.0000 | ORAL_TABLET | Freq: Two times a day (BID) | ORAL | Status: DC

## 2012-08-17 MED ORDER — DIATRIZOATE MEGLUMINE 30 % UR SOLN
URETHRAL | Status: DC | PRN
  Administered 2012-08-17: 15 mL

## 2012-08-17 MED ORDER — SUCCINYLCHOLINE CHLORIDE 20 MG/ML IJ SOLN
INTRAMUSCULAR | Status: DC | PRN
  Administered 2012-08-17: 100 mg via INTRAVENOUS

## 2012-08-17 MED ORDER — GENTAMICIN SULFATE 40 MG/ML IJ SOLN
140.0000 mg | INTRAVENOUS | Status: AC
  Administered 2012-08-17: 140 mg via INTRAVENOUS
  Filled 2012-08-17: qty 3.5

## 2012-08-17 MED ORDER — LACTATED RINGERS IV SOLN: INTRAVENOUS | Status: DC

## 2012-08-17 MED ORDER — TRAMADOL HCL 50 MG PO TABS: 50.0000 mg | ORAL_TABLET | Freq: Four times a day (QID) | ORAL | Status: DC | PRN

## 2012-08-17 MED ORDER — TISSEEL VH 10 ML EX KIT
PACK | CUTANEOUS | Status: DC | PRN
  Administered 2012-08-17: 10 mL

## 2012-08-17 MED ORDER — SODIUM CHLORIDE 0.9 % IR SOLN
Status: DC | PRN
  Administered 2012-08-17: 3000 mL

## 2012-08-17 MED ORDER — LIDOCAINE HCL 4 % MT SOLN
OROMUCOSAL | Status: DC | PRN
  Administered 2012-08-17: 4 mL via TOPICAL

## 2012-08-17 MED ORDER — LACTATED RINGERS IV SOLN
INTRAVENOUS | Status: DC
  Administered 2012-08-17: 1000 mL via INTRAVENOUS
  Administered 2012-08-17 – 2012-08-18 (×2): 125 mL/h via INTRAVENOUS

## 2012-08-17 MED ORDER — PROPOFOL 10 MG/ML IV BOLUS
INTRAVENOUS | Status: DC | PRN
  Administered 2012-08-17: 140 mg via INTRAVENOUS

## 2012-08-17 MED ORDER — MIDAZOLAM HCL 5 MG/5ML IJ SOLN
INTRAMUSCULAR | Status: DC | PRN
  Administered 2012-08-17: 2 mg via INTRAVENOUS

## 2012-08-17 MED ORDER — HYDROMORPHONE HCL PF 1 MG/ML IJ SOLN: 0.2500 mg | INTRAMUSCULAR | Status: DC | PRN

## 2012-08-17 MED ORDER — OXYBUTYNIN CHLORIDE ER 10 MG PO TB24
10.0000 mg | ORAL_TABLET | Freq: Every day | ORAL | Status: DC
  Administered 2012-08-17: 10 mg via ORAL
  Filled 2012-08-17 (×2): qty 1

## 2012-08-17 MED ORDER — ONDANSETRON HCL 4 MG/2ML IJ SOLN
INTRAMUSCULAR | Status: DC | PRN
  Administered 2012-08-17: 4 mg via INTRAVENOUS

## 2012-08-17 MED ORDER — SENNA-DOCUSATE SODIUM 8.6-50 MG PO TABS: 1.0000 | ORAL_TABLET | Freq: Two times a day (BID) | ORAL | Status: DC

## 2012-08-17 MED ORDER — LIDOCAINE HCL (CARDIAC) 20 MG/ML IV SOLN
INTRAVENOUS | Status: DC | PRN
  Administered 2012-08-17: 50 mg via INTRAVENOUS

## 2012-08-17 MED ORDER — GENTAMICIN SULFATE 40 MG/ML IJ SOLN
427.5000 mg | INTRAVENOUS | Status: DC | PRN
  Administered 2012-08-17: 140 mg via INTRAVENOUS

## 2012-08-17 MED ORDER — EPHEDRINE SULFATE 50 MG/ML IJ SOLN
INTRAMUSCULAR | Status: DC | PRN
  Administered 2012-08-17: 5 mg via INTRAVENOUS
  Administered 2012-08-17: 10 mg via INTRAVENOUS

## 2012-08-17 SURGICAL SUPPLY — 48 items
APL SKNCLS STERI-STRIP NONHPOA (GAUZE/BANDAGES/DRESSINGS)
BAG URINE DRAINAGE (UROLOGICAL SUPPLIES) ×1 IMPLANT
BASKET LASER NITINOL 1.9FR (BASKET) IMPLANT
BASKET ZERO TIP NITINOL 2.4FR (BASKET) ×1 IMPLANT
BENZOIN TINCTURE PRP APPL 2/3 (GAUZE/BANDAGES/DRESSINGS) ×4 IMPLANT
BLADE SURG 15 STRL LF DISP TIS (BLADE) ×1 IMPLANT
BLADE SURG 15 STRL SS (BLADE)
BSKT STON RTRVL 120 1.9FR (BASKET)
BSKT STON RTRVL ZERO TP 2.4FR (BASKET)
CATCHER STONE W/TUBE ADAPTER (UROLOGICAL SUPPLIES) ×1 IMPLANT
CATH FOLEY 2W COUNCIL 20FR 5CC (CATHETERS) IMPLANT
CATH ROBINSON RED A/P 20FR (CATHETERS) IMPLANT
CATH X-FORCE N30 NEPHROSTOMY (TUBING) ×1 IMPLANT
CLOTH BEACON ORANGE TIMEOUT ST (SAFETY) ×2 IMPLANT
COVER SURGICAL LIGHT HANDLE (MISCELLANEOUS) ×2 IMPLANT
DRAPE C-ARM 42X72 X-RAY (DRAPES) ×2 IMPLANT
DRAPE CAMERA CLOSED 9X96 (DRAPES) ×2 IMPLANT
DRAPE LINGEMAN PERC (DRAPES) ×2 IMPLANT
DRAPE SURG IRRIG POUCH 19X23 (DRAPES) ×1 IMPLANT
DRSG TEGADERM 8X12 (GAUZE/BANDAGES/DRESSINGS) ×3 IMPLANT
GLOVE SURG SS PI 8.0 STRL IVOR (GLOVE) ×1 IMPLANT
GOWN STRL REIN XL XLG (GOWN DISPOSABLE) ×3 IMPLANT
IV CATH 14GX2 1/4 (CATHETERS) ×1 IMPLANT
IV NS IRRIG 3000ML ARTHROMATIC (IV SOLUTION) ×2 IMPLANT
KIT BASIN OR (CUSTOM PROCEDURE TRAY) ×2 IMPLANT
LASER FIBER DISP (UROLOGICAL SUPPLIES) IMPLANT
LASER FIBER DISP 1000U (UROLOGICAL SUPPLIES) IMPLANT
MANIFOLD NEPTUNE II (INSTRUMENTS) ×2 IMPLANT
NS IRRIG 1000ML POUR BTL (IV SOLUTION) ×1 IMPLANT
PACK BASIC VI WITH GOWN DISP (CUSTOM PROCEDURE TRAY) ×2 IMPLANT
PAD ABD 7.5X8 STRL (GAUZE/BANDAGES/DRESSINGS) ×2 IMPLANT
PROBE LITHOCLAST ULTRA 3.8X403 (UROLOGICAL SUPPLIES) ×1 IMPLANT
PROBE PNEUMATIC 1.0MMX570MM (UROLOGICAL SUPPLIES) ×1 IMPLANT
SET IRRIG Y TYPE TUR BLADDER L (SET/KITS/TRAYS/PACK) ×2 IMPLANT
SET WARMING FLUID IRRIGATION (MISCELLANEOUS) ×1 IMPLANT
SPONGE GAUZE 4X4 12PLY (GAUZE/BANDAGES/DRESSINGS) ×1 IMPLANT
SPONGE LAP 4X18 X RAY DECT (DISPOSABLE) ×2 IMPLANT
STENT CONTOUR 6FRX24X.038 (STENTS) ×1 IMPLANT
STONE CATCHER W/TUBE ADAPTER (UROLOGICAL SUPPLIES) IMPLANT
SUT SILK 2 0 30  PSL (SUTURE)
SUT SILK 2 0 30 PSL (SUTURE) ×1 IMPLANT
SUT VIC AB 3-0 SH 27 (SUTURE) ×2
SUT VIC AB 3-0 SH 27X BRD (SUTURE) IMPLANT
SYR 20CC LL (SYRINGE) ×3 IMPLANT
SYRINGE 10CC LL (SYRINGE) ×1 IMPLANT
TOWEL OR NON WOVEN STRL DISP B (DISPOSABLE) ×2 IMPLANT
TRAY FOLEY CATH 14FRSI W/METER (CATHETERS) ×2 IMPLANT
TUBING CONNECTING 10 (TUBING) ×4 IMPLANT

## 2012-08-17 NOTE — Progress Notes (Signed)
2 Days Post-Op  Subjective:  1 - Left Partial Staghorn Renal Stone - s/p left 1st stage PCNL 08/15/2012. No events overnight. Pain controlled. Tolerating diet. Foley now out due to bladder spasms.  CT after 1st stage procedure with two small residual fragments, one lower and one mid pole, planning for second stage procedure today.   Objective: Vital signs in last 24 hours: Temp:  [98.5 F (36.9 C)-100.2 F (37.9 C)] 98.5 F (36.9 C) (11/22 0427) Pulse Rate:  [68-78] 68  (11/22 0427) Resp:  [18-20] 18  (11/22 0427) BP: (103-160)/(52-85) 159/84 mmHg (11/22 0427) SpO2:  [91 %-95 %] 92 % (11/22 0427) Last BM Date: 08/14/12  Intake/Output from previous day: 11/21 0701 - 11/22 0700 In: 2046.3 [P.O.:240; I.V.:1806.3] Out: 2225 [Urine:2225] Intake/Output this shift: Total I/O In: 883.8 [I.V.:883.8] Out: 975 [Urine:975]  General appearance: alert, cooperative and appears stated age Head: Normocephalic, without obvious abnormality, atraumatic Eyes: conjunctivae/corneas clear. PERRL, EOM's intact. Fundi benign. Ears: normal TM's and external ear canals both ears Throat: lips, mucosa, and tongue normal; teeth and gums normal Back: symmetric, no curvature. ROM normal. No CVA tenderness. Resp: clear to auscultation bilaterally Cardio: regular rate and rhythm, S1, S2 normal, no murmur, click, rub or gallop GI: soft, non-tender; bowel sounds normal; no masses,  no organomegaly Extremities: extremities normal, atraumatic, no cyanosis or edema Pulses: 2+ and symmetric Skin: Skin color, texture, turgor normal. No rashes or lesions Lymph nodes: Cervical, supraclavicular, and axillary nodes normal. Neurologic: Grossly normal WOUND: left flank with neph tube and nephroureteral stent in situ. Some expected peri-drain drainage. No purulence.   Lab Results:   Basename 08/16/12 0435 08/15/12 1207  WBC -- --  HGB 11.1* 12.2  HCT 33.1* 35.9*  PLT -- --   BMET  Basename 08/16/12 0435  08/15/12 1207  NA 136 138  K 4.1 4.0  CL 101 104  CO2 27 26  GLUCOSE 125* 129*  BUN 11 17  CREATININE 0.74 0.78  CALCIUM 8.6 8.6   PT/INR No results found for this basename: LABPROT:2,INR:2 in the last 72 hours ABG No results found for this basename: PHART:2,PCO2:2,PO2:2,HCO3:2 in the last 72 hours  Studies/Results: Ct Abdomen Pelvis Wo Contrast  08/16/2012  *RADIOLOGY REPORT*  Clinical Data: Post left nephrolithotomy.  Evaluate residual stone burden  CT ABDOMEN AND PELVIS WITHOUT CONTRAST  Technique:  Multidetector CT imaging of the abdomen and pelvis was performed following the standard protocol without intravenous contrast.  Comparison: CT 06/17/2012  Findings: Left percutaneous nephrostomy tube is in good position in the renal pelvis.  Left ureteral stent is in good position. Previously  identified stone in the left lower pole is smaller compared with the prior study.  This stone is in the posterior lateral calix of the left lower pole and measures 4.0 mm compared with 11 x 13 mm previously.  No other stones on the left side and no hydronephrosis.  There is a 2 mm nonobstructing stone right lower pole which is unchanged.  Mild atelectasis in the lung bases.  Liver spleen and pancreas are normal.  Slight layering density in the gallbladder may represent vicarious excretion of contrast versus tiny stones.  This was not present previously and therefore is most likely contrast.  Adynamic ileus with dilatation of large and small bowel.  Appendix is normal.  Bladder is decompressed with a Foley catheter.  No free fluid in the pelvis.  IMPRESSION: Left percutaneous nephrostomy good position.  Left ureteral stent in good position.  4  mm left lower pole calculus remains.  Tiny nonobstructing 2 mm right lower pole calculus is unchanged.   Original Report Authenticated By: Janeece Riggers, M.D.    Dg Abd 1 View  08/15/2012  *RADIOLOGY REPORT*  Clinical Data: Left staghorn renal calculus.  DG C-ARM 61-120  MIN - NRPT [facility],RETROGRADE PYELOGRAM,ABDOMEN - 1 VIEW  Comparison: CT abdomen pelvis 06/17/2012.  Findings: Multiple C-arm films demonstrate retrograde pyelogram which opacifies the left ureter and left collecting system. There is indwelling left nephro ureteral stent.  Subsequent c-arm films document left nephrostomy tube placement and nephrostomy tube injection which confirms satisfactory nephrostomy tube placement. Filling defects within the left collecting system represent staghorn calculus.  IMPRESSION: As above.   Original Report Authenticated By: Davonna Belling, M.D.    Dg Retrograde Pyelogram  08/15/2012  *RADIOLOGY REPORT*  Clinical Data: Left staghorn renal calculus.  DG C-ARM 61-120 MIN - NRPT [facility],RETROGRADE PYELOGRAM,ABDOMEN - 1 VIEW  Comparison: CT abdomen pelvis 06/17/2012.  Findings: Multiple C-arm films demonstrate retrograde pyelogram which opacifies the left ureter and left collecting system. There is indwelling left nephro ureteral stent.  Subsequent c-arm films document left nephrostomy tube placement and nephrostomy tube injection which confirms satisfactory nephrostomy tube placement. Filling defects within the left collecting system represent staghorn calculus.  IMPRESSION: As above.   Original Report Authenticated By: Davonna Belling, M.D.    Dg C-arm 61-120 Min-no Report  08/15/2012  *RADIOLOGY REPORT*  Clinical Data: Left staghorn renal calculus.  DG C-ARM 61-120 MIN - NRPT [facility],RETROGRADE PYELOGRAM,ABDOMEN - 1 VIEW  Comparison: CT abdomen pelvis 06/17/2012.  Findings: Multiple C-arm films demonstrate retrograde pyelogram which opacifies the left ureter and left collecting system. There is indwelling left nephro ureteral stent.  Subsequent c-arm films document left nephrostomy tube placement and nephrostomy tube injection which confirms satisfactory nephrostomy tube placement. Filling defects within the left collecting system represent staghorn calculus.  IMPRESSION: As above.   Original  Report Authenticated By: Davonna Belling, M.D.     Anti-infectives: Anti-infectives     Start     Dose/Rate Route Frequency Ordered Stop   08/15/12 0000   gentamicin (GARAMYCIN) 350 mg in dextrose 5 % 100 mL IVPB        350 mg 108.8 mL/hr over 60 Minutes Intravenous  Once 08/14/12 2016 08/15/12 0824          Assessment/Plan: 1 - Left Partial Staghorn Renal Stone - Again we discussed options for residual fragment including observation v. Second stage procedure. She wants to proceed. Risks including bleeding, infection, damage to kidney / loss of kidney as well as rare risks such as DVT,PE,MI,CVA, Mortality once again re-iterated.  Will likely try to internalize all tubes with procedure today to minimize risk of wound drainage since pt wants to travel in coming weeks.   Marie Dominguez, Zosia Lucchese 08/17/2012

## 2012-08-17 NOTE — Progress Notes (Signed)
MEDICATION RELATED CONSULT NOTE - INITIAL   Pharmacy Consult for Gentamicin Indication: Perioperative prophylaxis  No Known Allergies  Patient Measurements: Height: 5\' 8"  (172.7 cm) Weight: 188 lb 9.6 oz (85.548 kg) IBW/kg (Calculated) : 63.9  Adjusted Body Weight: 70 kg  Vital Signs: Temp: 98.5 F (36.9 C) (11/22 0427) Temp src: Oral (11/22 0427) BP: 159/84 mmHg (11/22 0427) Pulse Rate: 68  (11/22 0427) Intake/Output from previous day: 11/21 0701 - 11/22 0700 In: 2046.3 [P.O.:240; I.V.:1806.3] Out: 2225 [Urine:2225] Intake/Output from this shift:    Labs:  Basename 08/16/12 0435 08/15/12 1207  WBC -- --  HGB 11.1* 12.2  HCT 33.1* 35.9*  PLT -- --  APTT -- --  CREATININE 0.74 0.78  LABCREA -- --  CREATININE 0.74 0.78  CREAT24HRUR -- --  MG -- --  PHOS -- --  ALBUMIN -- --  PROT -- --  ALBUMIN -- --  AST -- --  ALT -- --  ALKPHOS -- --  BILITOT -- --  BILIDIR -- --  IBILI -- --   Estimated Creatinine Clearance: 82.4 ml/min (by C-G formula based on Cr of 0.74).   Microbiology: Recent Results (from the past 720 hour(s))  SURGICAL PCR SCREEN     Status: Normal   Collection Time   08/13/12  9:09 AM      Component Value Range Status Comment   MRSA, PCR NEGATIVE  NEGATIVE Final    Staphylococcus aureus NEGATIVE  NEGATIVE Final     Medical History: Past Medical History  Diagnosis Date  . Thyroid disease   . Hypertension   . Acid reflux   . Kidney stones   . Hypothyroidism   . Arthritis     Medications:  Scheduled:    . [COMPLETED] acetaminophen  1,000 mg Intravenous Q6H  . atorvastatin  40 mg Oral q1800  . citalopram  40 mg Oral q morning - 10a  . docusate sodium  100 mg Oral BID  . levothyroxine  50 mcg Oral Q0600  . lisinopril  10 mg Oral Daily  . oxybutynin  10 mg Oral Daily  . pantoprazole  40 mg Oral Daily  . senna  1 tablet Oral BID   Infusions:    . dextrose 5 % and 0.45 % NaCl with KCl 20 mEq/L 75 mL/hr at 08/17/12 1610   PRN:  HYDROmorphone (DILAUDID) injection, ondansetron, oxyCODONE  Assessment:  61 y/o F with L partial staghorn kidney stone, s/p percutaneous nephrolithotomy 11/20 with placement of ureteral stent and nephrostomy tube, has two small residual stone fragments.  For second stage procedure today.  LOW-DOSE gentamicin requested pre-operatively.  Patient received gentamicin 5mg /kg adjusted body-weight (=350mg ) pre-operatively on 11/20 as per standard protocol.   SCr WNL and stable 11/21.  Goal of Therapy:   Prevention of systemic infection  Plan:   Gentamicin 140 mg (2mg /kg based on adjusted body weight) x 1 pre-op today.  Follow clinical course.  Elie Goody, PharmD, BCPS Pager: [phone] 08/17/2012  7:19 AM

## 2012-08-17 NOTE — Transfer of Care (Signed)
Immediate Anesthesia Transfer of Care Note  Patient: Marie Dominguez  Procedure(s) Performed: Procedure(s) (LRB) with comments: NEPHROLITHOTOMY PERCUTANEOUS SECOND LOOK (Left) - LEFT 2ND STAGE PCNL  Patient Location: PACU  Anesthesia Type:General  Level of Consciousness: awake, alert , oriented and patient cooperative  Airway & Oxygen Therapy: Patient Spontanous Breathing and Patient connected to face mask oxygen  Post-op Assessment: Report given to PACU RN, Post -op Vital signs reviewed and stable and Patient moving all extremities X 4  Post vital signs: stable  Complications: No apparent anesthesia complications

## 2012-08-17 NOTE — Anesthesia Postprocedure Evaluation (Signed)
  Anesthesia Post-op Note  Patient: Marie Dominguez  Procedure(s) Performed: Procedure(s) (LRB): NEPHROLITHOTOMY PERCUTANEOUS SECOND LOOK (Left)  Patient Location: PACU  Anesthesia Type: General  Level of Consciousness: awake and alert   Airway and Oxygen Therapy: Patient Spontanous Breathing  Post-op Pain: mild  Post-op Assessment: Post-op Vital signs reviewed, Patient's Cardiovascular Status Stable, Respiratory Function Stable, Patent Airway and No signs of Nausea or vomiting  Last Vitals:  Filed Vitals:   08/17/12 2000  BP: 128/66  Pulse: 65  Temp:   Resp: 12    Post-op Vital Signs: stable   Complications: No apparent anesthesia complications

## 2012-08-17 NOTE — Brief Op Note (Signed)
08/15/2012 - 08/17/2012  7:04 PM  PATIENT:  Marie Dominguez  61 y.o. female  PRE-OPERATIVE DIAGNOSIS:  LEFT RENAL STONE  POST-OPERATIVE DIAGNOSIS:  left renal stone  PROCEDURE:  Procedure(s) (LRB) with comments: NEPHROLITHOTOMY PERCUTANEOUS SECOND LOOK (Left) - LEFT 2ND STAGE PCNL  SURGEON:  Surgeon(s) and Role:    * Sebastian Ache, MD - Primary  PHYSICIAN ASSISTANT:   ASSISTANTS: none   ANESTHESIA:   general  EBL:     BLOOD ADMINISTERED:none  DRAINS: Urinary Catheter (Foley)   LOCAL MEDICATIONS USED:  NONE  SPECIMEN:  Source of Specimen:  left renal stone - given to patient  DISPOSITION OF SPECIMEN:  N/A  COUNTS:  YES  TOURNIQUET:  * No tourniquets in log *  DICTATION: .Other Dictation: Dictation Number [id number]  PLAN OF CARE: Admit to inpatient   PATIENT DISPOSITION:  PACU - hemodynamically stable.   Delay start of Pharmacological VTE agent (>24hrs) due to surgical blood loss or risk of bleeding: yes

## 2012-08-17 NOTE — Anesthesia Preprocedure Evaluation (Signed)
Anesthesia Evaluation  Patient identified by MRN, date of birth, ID band Patient awake    Reviewed: Allergy & Precautions, H&P , NPO status , Patient's Chart, lab work & pertinent test results  Airway Mallampati: II TM Distance: >3 FB Neck ROM: full    Dental No notable dental hx. (+) Teeth Intact and Dental Advisory Given   Pulmonary neg pulmonary ROS,  breath sounds clear to auscultation  Pulmonary exam normal       Cardiovascular Exercise Tolerance: Good hypertension, Pt. on medications Rhythm:regular Rate:Normal     Neuro/Psych negative neurological ROS  negative psych ROS   GI/Hepatic negative GI ROS, Neg liver ROS, GERD-  Medicated and Controlled,  Endo/Other  negative endocrine ROSHypothyroidism   Renal/GU negative Renal ROS  negative genitourinary   Musculoskeletal   Abdominal   Peds  Hematology negative hematology ROS (+)   Anesthesia Other Findings   Reproductive/Obstetrics negative OB ROS                          Anesthesia Physical Anesthesia Plan  ASA: II  Anesthesia Plan: General   Post-op Pain Management:    Induction: Intravenous  Airway Management Planned: Oral ETT  Additional Equipment:   Intra-op Plan:   Post-operative Plan: Extubation in OR  Informed Consent: I have reviewed the patients History and Physical, chart, labs and discussed the procedure including the risks, benefits and alternatives for the proposed anesthesia with the patient or authorized representative who has indicated his/her understanding and acceptance.   Dental Advisory Given  Plan Discussed with: CRNA and Surgeon  Anesthesia Plan Comments:         Anesthesia Quick Evaluation  

## 2012-08-18 NOTE — Progress Notes (Signed)
1 Day Post-Op Subjective: Patient reports that she has no pain. She is feeling well  Objective: Vital signs in last 24 hours: Temp:  [98.1 F (36.7 C)-99.1 F (37.3 C)] 98.4 F (36.9 C) (11/23 0148) Pulse Rate:  [65-78] 69  (11/23 0148) Resp:  [12-18] 16  (11/23 0148) BP: (125-145)/(66-83) 145/83 mmHg (11/23 0148) SpO2:  [94 %-100 %] 98 % (11/23 0148)  Intake/Output from previous day: 11/22 0701 - 11/23 0700 In: 2577.5 [I.V.:2577.5] Out: 4175 [Urine:4175] Intake/Output this shift: Total I/O In: 725 [I.V.:725] Out: 1375 [Urine:1375]  Physical Exam:  Constitutional: Vital signs reviewed. WD WN in NAD   Flank dressing is dry. She has no flank ecchymosis.  Lab Results:  Basename 08/16/12 0435 08/15/12 1207  HGB 11.1* 12.2  HCT 33.1* 35.9*   BMET  Basename 08/16/12 0435 08/15/12 1207  NA 136 138  K 4.1 4.0  CL 101 104  CO2 27 26  GLUCOSE 125* 129*  BUN 11 17  CREATININE 0.74 0.78  CALCIUM 8.6 8.6   No results found for this basename: LABPT:3,INR:3 in the last 72 hours No results found for this basename: LABURIN:1 in the last 72 hours Results for orders placed during the hospital encounter of 08/13/12  SURGICAL PCR SCREEN     Status: Normal   Collection Time   08/13/12  9:09 AM      Component Value Range Status Comment   MRSA, PCR NEGATIVE  NEGATIVE Final    Staphylococcus aureus NEGATIVE  NEGATIVE Final     Studies/Results: Ct Abdomen Pelvis Wo Contrast  08/16/2012  *RADIOLOGY REPORT*  Clinical Data: Post left nephrolithotomy.  Evaluate residual stone burden  CT ABDOMEN AND PELVIS WITHOUT CONTRAST  Technique:  Multidetector CT imaging of the abdomen and pelvis was performed following the standard protocol without intravenous contrast.  Comparison: CT 06/17/2012  Findings: Left percutaneous nephrostomy tube is in good position in the renal pelvis.  Left ureteral stent is in good position. Previously  identified stone in the left lower pole is smaller compared  with the prior study.  This stone is in the posterior lateral calix of the left lower pole and measures 4.0 mm compared with 11 x 13 mm previously.  No other stones on the left side and no hydronephrosis.  There is a 2 mm nonobstructing stone right lower pole which is unchanged.  Mild atelectasis in the lung bases.  Liver spleen and pancreas are normal.  Slight layering density in the gallbladder may represent vicarious excretion of contrast versus tiny stones.  This was not present previously and therefore is most likely contrast.  Adynamic ileus with dilatation of large and small bowel.  Appendix is normal.  Bladder is decompressed with a Foley catheter.  No free fluid in the pelvis.  IMPRESSION: Left percutaneous nephrostomy good position.  Left ureteral stent in good position.  4 mm left lower pole calculus remains.  Tiny nonobstructing 2 mm right lower pole calculus is unchanged.   Original Report Authenticated By: Janeece Riggers, M.D.    Dg Abd 1 View  08/17/2012  *RADIOLOGY REPORT*  Clinical Data: Second look left percutaneous nephrolithotomy.  ABDOMEN - 1 VIEW  Comparison: 08/15/2012  Findings: Imaging obtained intraoperatively demonstrates a left nephrostomy tube with injection partially opacifying the renal collecting system.  Ultimately, a ureteral stent is visualized extending from the level of the renal pelvis to the bladder.  IMPRESSION: Imaging obtained during left percutaneous nephrolithotomy.   Original Report Authenticated By: Irish Lack, M.D.  Dg C-arm 1-60 Min-no Report  08/17/2012  CLINICAL DATA: surgery   C-ARM 1-60 MINUTES  Fluoroscopy was utilized by the requesting physician.  No radiographic  interpretation.      Assessment/Plan:   Status post left percutaneous nephrolithotomy. She is doing well, and set for discharge.   LOS: 3 days   Marcine Matar M 08/18/2012, 6:06 AM

## 2012-08-18 NOTE — Op Note (Signed)
Marie Dominguez, Marie Dominguez NO.:  [id number]  MEDICAL RECORD NO.:  [id number]  LOCATION:  1413                         FACILITY:  [facility]  PHYSICIAN:  Sebastian Ache, MD     DATE OF BIRTH:  Nov 08, 1950  DATE OF PROCEDURE:  08/17/2012 DATE OF DISCHARGE:  08/18/2012                              OPERATIVE REPORT   DIAGNOSIS:  Residual left renal stone.  PROCEDURE: 1. Left second stage percutaneous nephrostolithotomy for stone less     than 2 cm. 2. Left antegrade nephrostogram with interpretation. 3. Placement of left ureteral stent, no tether.  FINDINGS: 1. Two small residual calcifications, 1 in the lower pole     approximately 4 mm, another 1 along the previous tract     approximately 2 mm, each completely removed. 2. Unremarkable antegrade nephrostogram. 3. Placement of double-J stent, distal end in the bladder and proximal     end in the renal pelvis.  ESTIMATED BLOOD LOSS:  Nil.  COMPLICATIONS:  None.  SPECIMEN:  Left renal stone given to patient.  INDICATION:  Marie Dominguez is a pleasant 61 year old lady who was found on workup of left flank pain to have a left partial staghorn kidney stone. She underwent left first stage percutaneous nephrostolithotomy on August 15, 2012.  At which point, approximately 90-95% of the stone volume was removed.  She had a followup CT scan on August 16, 2012, as we felt that we may have made her stone free; however, this did reveal 2 small residual calcifications.  Options were discussed for management including observation versus second-stage procedure, she wished to proceed with the latter.  Informed consent was obtained and placed in medical record.  PROCEDURE IN DETAIL:  The patient being Marie Dominguez, was verified. Procedure being left second stage percutaneous nephrostolithotomy was confirmed.  Procedure was carried out.  Time-out was performed. Intravenous antibiotics were administered.  General endotracheal anesthesia  was introduced.  The patient had a Foley catheter placed per urethra to straight drain.  She was then placed into a prone position. Sterile field was created by prepping and draping the patient's entire left flank including her previous nephrostomy tube and nephroureteral stent which were brought into the operative field, the percutaneous drape was applied.  Next, antegrade nephrostogram was obtained.  Left antegrade nephrostogram demonstrated a single system left kidney and a single left ureter.  There were no filling defects or extravasation.  The previous nephrostomy tube was then carefully removed, the nephroureteral stent was left in situ as a safety wire.  A flexible nephroscopy was then systematically performed of each calix using a 16-French flexible cystoscope.  Two separate calcifications were noted.  A single very small approximately 2 mm calcifications were noted along the previous percutaneous tract.  This was grasped and brought out in its entirety.  Another small fragment  4 mm in diameter was seen in the lower pole calyx. This was also grasped and brought out in its entirety with an escape basket.  Repeat examination revealed no additional calcifications.  Additionally, we had successfully removed the 2 fragments, which were previously noted on CT. Finally, a new sensor wire was advanced down to  the level of the nephroureteral stent to the urinary bladder and the nephroureteral stent was exchanged for a double J-stent which was placed using nephroscopic and fluoroscopic guidance.  Good proximal and distal curl were noted. The percutaneous tract was closed using 5 mL of Tisseel, followed by interrupted Vicryl to the level of the skin.  Procedure was then terminated.  The patient tolerated the procedure well.  There were no immediate periprocedural complications.  The patient was taken to the postanesthesia care unit in stable condition.           ______________________________ Sebastian Ache, MD     TM/MEDQ  D:  08/17/2012  T:  08/18/2012  Job:  [id number]

## 2012-08-18 NOTE — Discharge Summary (Signed)
Patient ID: Marie Dominguez MRN: [id number] DOB/AGE: [birth year] 61 y.o.  Admit date: 08/15/2012 Discharge date: 08/18/2012  Primary Care Physician:  Hollice Espy, MD  Discharge Diagnoses:   Left renal calculus  Consults:  None     Discharge Medications:   Medication List     As of 08/18/2012  8:00 AM    TAKE these medications         citalopram 40 MG tablet   Commonly known as: CELEXA   Take 40 mg by mouth every morning.      esomeprazole 40 MG capsule   Commonly known as: NEXIUM   Take 40 mg by mouth daily before breakfast.      fish oil-omega-3 fatty acids 1000 MG capsule   Take 2 g by mouth 2 (two) times daily.      glucosamine-chondroitin 500-400 MG tablet   Take 2 tablets by mouth daily.      HYDROcodone-acetaminophen 5-325 MG per tablet   Commonly known as: NORCO/VICODIN   1-2 tablets po q 6 hours prn moderate to severe pain      levothyroxine 50 MCG tablet   Commonly known as: SYNTHROID, LEVOTHROID   Take 50 mcg by mouth every morning.      lisinopril 10 MG tablet   Commonly known as: PRINIVIL,ZESTRIL   Take 10 mg by mouth daily.      multivitamin with minerals tablet   Take 1 tablet by mouth daily.      rosuvastatin 20 MG tablet   Commonly known as: CRESTOR   Take 20 mg by mouth at bedtime.      sennosides-docusate sodium 8.6-50 MG tablet   Commonly known as: SENOKOT-S   Take 1 tablet by mouth 2 (two) times daily. While taking pain medications to prevent constipation      sulfamethoxazole-trimethoprim 400-80 MG per tablet   Commonly known as: BACTRIM,SEPTRA   Take 1 tablet by mouth 2 (two) times daily. X 3 days. Begin day prior to next Urology appointment.      traMADol 50 MG tablet   Commonly known as: ULTRAM   Take 1 tablet (50 mg total) by mouth every 6 (six) hours as needed.      Vitamin D (Ergocalciferol) 50000 UNITS Caps   Commonly known as: DRISDOL   Take 50,000 Units by mouth every Thursday.         Significant Diagnostic  Studies:  Dg Chest 2 View  08/13/2012  *RADIOLOGY REPORT*  Clinical Data: 61 year old female preoperative study for percutaneous nephrolithotomy.  Hypertension.  CHEST - 2 VIEW  Comparison: CT abdomen 06/17/2012.  Findings: Mild eventration of the diaphragm.  Lung volumes are within normal limits.   Cardiac size and mediastinal contours are within normal limits.  Visualized tracheal air column is within normal limits.  Moderate apical scarring.  No pneumothorax, pulmonary edema, pleural effusion or consolidation. No acute osseous abnormality identified.  IMPRESSION: No acute cardiopulmonary abnormality.   Original Report Authenticated By: Erskine Speed, M.D.     Brief H and P: For complete details please refer to admission H and P, but in brief the patient is admitted for percutaneous management of a staghorn stone in her left kidney  Hospital Course:  The patient was admitted for or cutaneous nephrolithotomy. Her initial procedure was performed on 08/16/2012 with a second procedure performed the following day. She tolerated these procedures well. By 08/18/2012 she was doing well. She nephrostomy tube had been removed. She was discharged in stable condition  Day of Discharge BP 136/75  Pulse 68  Temp 98.4 F (36.9 C) (Oral)  Resp 16  Ht 5\' 8"  (1.727 m)  Wt 85.548 kg (188 lb 9.6 oz)  BMI 28.68 kg/m2  SpO2 93%  No results found for this or any previous visit (from the past 24 hour(s)).  Physical Exam: General: Alert and awake oriented x3 not in any acute distress. HEENT: anicteric sclera, pupils reactive to light and accommodation CVS: S1-S2 clear no murmur rubs or gallops Chest: clear to auscultation bilaterally, no wheezing rales or rhonchi Abdomen: soft nontender, nondistended, normal bowel sounds, no organomegaly Extremities: no cyanosis, clubbing or edema noted bilaterally Neuro: Cranial nerves II-XII intact, no focal neurological deficits  Disposition:  Home  Diet:  No  restrictions  Activity:  Discussed with patient   Disposition and Follow-up:    She will followup with Dr. Berneice Heinrich  TESTS THAT NEED FOLLOW-UP    DISCHARGE FOLLOW-UP     Follow-up Information    Follow up with Sebastian Ache, MD. (as scheduled for MD visit and stent removal)    Contact information:   [address] [place] [address] [phone]          Time spent on Discharge:  10 minutes  Signed: Chelsea Aus 08/18/2012, 8:00 AM

## 2012-08-20 ENCOUNTER — Encounter (HOSPITAL_COMMUNITY): Payer: Self-pay | Admitting: Urology

## 2012-11-19 ENCOUNTER — Other Ambulatory Visit (HOSPITAL_COMMUNITY): Payer: Self-pay | Admitting: Family Medicine

## 2012-12-13 ENCOUNTER — Ambulatory Visit (HOSPITAL_COMMUNITY): Payer: 59

## 2012-12-18 ENCOUNTER — Ambulatory Visit (HOSPITAL_COMMUNITY): Payer: 59

## 2012-12-26 ENCOUNTER — Ambulatory Visit (HOSPITAL_COMMUNITY)
Admission: RE | Admit: 2012-12-26 | Discharge: 2012-12-26 | Disposition: A | Discharge status: Home / Self Care | Payer: 59 | Source: Ambulatory Visit | Attending: Family Medicine | Admitting: Family Medicine

## 2012-12-26 DIAGNOSIS — Z1231 Encounter for screening mammogram for malignant neoplasm of breast: Secondary | ICD-10-CM | POA: Insufficient documentation

## 2013-08-28 ENCOUNTER — Emergency Department (HOSPITAL_COMMUNITY)
Admission: EM | Admit: 2013-08-28 | Discharge: 2013-08-28 | Disposition: A | Discharge status: Home / Self Care | Payer: 59 | Attending: Emergency Medicine | Admitting: Emergency Medicine

## 2013-08-28 ENCOUNTER — Encounter (HOSPITAL_COMMUNITY): Payer: Self-pay | Admitting: Emergency Medicine

## 2013-08-28 ENCOUNTER — Emergency Department (HOSPITAL_COMMUNITY): Payer: 59

## 2013-08-28 DIAGNOSIS — I10 Essential (primary) hypertension: Secondary | ICD-10-CM | POA: Insufficient documentation

## 2013-08-28 DIAGNOSIS — N201 Calculus of ureter: Secondary | ICD-10-CM | POA: Insufficient documentation

## 2013-08-28 DIAGNOSIS — R110 Nausea: Secondary | ICD-10-CM | POA: Insufficient documentation

## 2013-08-28 DIAGNOSIS — Z87442 Personal history of urinary calculi: Secondary | ICD-10-CM | POA: Insufficient documentation

## 2013-08-28 DIAGNOSIS — Z8739 Personal history of other diseases of the musculoskeletal system and connective tissue: Secondary | ICD-10-CM | POA: Insufficient documentation

## 2013-08-28 DIAGNOSIS — E039 Hypothyroidism, unspecified: Secondary | ICD-10-CM | POA: Insufficient documentation

## 2013-08-28 DIAGNOSIS — K219 Gastro-esophageal reflux disease without esophagitis: Secondary | ICD-10-CM | POA: Insufficient documentation

## 2013-08-28 DIAGNOSIS — Z87891 Personal history of nicotine dependence: Secondary | ICD-10-CM | POA: Insufficient documentation

## 2013-08-28 DIAGNOSIS — Z79899 Other long term (current) drug therapy: Secondary | ICD-10-CM | POA: Insufficient documentation

## 2013-08-28 LAB — BASIC METABOLIC PANEL
BUN: 23 mg/dL (ref 6–23)
Calcium: 9.7 mg/dL (ref 8.4–10.5)
Chloride: 100 mEq/L (ref 96–112)
Creatinine, Ser: 0.73 mg/dL (ref 0.50–1.10)
GFR calc non Af Amer: 88 mL/min — ABNORMAL LOW (ref >90)
Glucose, Bld: 106 mg/dL — ABNORMAL HIGH (ref 70–99)
Potassium: 3.4 mEq/L — ABNORMAL LOW (ref 3.5–5.1)

## 2013-08-28 LAB — CBC WITH DIFFERENTIAL/PLATELET
Basophils Absolute: 0 10*3/uL (ref 0.0–0.1)
Basophils Relative: 1 % (ref 0–1)
Hemoglobin: 13.3 g/dL (ref 12.0–15.0)
MCHC: 35.1 g/dL (ref 30.0–36.0)
Neutro Abs: 2.4 10*3/uL (ref 1.7–7.7)
Neutrophils Relative %: 55 % (ref 43–77)
RDW: 12.5 % (ref 11.5–15.5)
WBC: 4.4 10*3/uL (ref 4.0–10.5)

## 2013-08-28 LAB — URINALYSIS, ROUTINE W REFLEX MICROSCOPIC
Bilirubin Urine: NEGATIVE
Glucose, UA: NEGATIVE mg/dL
Ketones, ur: NEGATIVE mg/dL
Protein, ur: NEGATIVE mg/dL
Urobilinogen, UA: 1 mg/dL (ref 0.0–1.0)

## 2013-08-28 LAB — URINE MICROSCOPIC-ADD ON

## 2013-08-28 MED ORDER — TAMSULOSIN HCL 0.4 MG PO CAPS: 0.4000 mg | ORAL_CAPSULE | Freq: Every day | ORAL | Status: DC

## 2013-08-28 MED ORDER — MORPHINE SULFATE 4 MG/ML IJ SOLN
4.0000 mg | Freq: Once | INTRAMUSCULAR | Status: AC
  Administered 2013-08-28: 4 mg via INTRAVENOUS
  Filled 2013-08-28: qty 1

## 2013-08-28 MED ORDER — KETOROLAC TROMETHAMINE 30 MG/ML IJ SOLN
30.0000 mg | Freq: Once | INTRAMUSCULAR | Status: AC
  Administered 2013-08-28: 30 mg via INTRAVENOUS
  Filled 2013-08-28: qty 1

## 2013-08-28 MED ORDER — ONDANSETRON HCL 4 MG PO TABS: 4.0000 mg | ORAL_TABLET | Freq: Four times a day (QID) | ORAL | Status: DC

## 2013-08-28 MED ORDER — ONDANSETRON HCL 4 MG/2ML IJ SOLN
4.0000 mg | Freq: Once | INTRAMUSCULAR | Status: AC
  Administered 2013-08-28: 4 mg via INTRAVENOUS
  Filled 2013-08-28: qty 2

## 2013-08-28 MED ORDER — HYDROCODONE-ACETAMINOPHEN 5-325 MG PO TABS: 1.0000 | ORAL_TABLET | Freq: Four times a day (QID) | ORAL | Status: DC | PRN

## 2013-08-28 MED ORDER — SODIUM CHLORIDE 0.9 % IV BOLUS (SEPSIS)
1000.0000 mL | Freq: Once | INTRAVENOUS | Status: AC
  Administered 2013-08-28: 1000 mL via INTRAVENOUS

## 2013-08-28 NOTE — ED Notes (Signed)
Pt escorted to discharge window. Verbalized understanding discharge instructions. In no acute distress.  Vitals reviewed.   

## 2013-08-28 NOTE — ED Notes (Addendum)
Pt c/o R flank pain, increased urinary frequency, and decreased urinary output starting this morning.  Pain score 8/10 and increasing.  Hx of kidney stones.  Sts "I thought it was an urinary tract infection, but it's starting to feel like a kidney stone.  I had to have surgery last September to remove a kidney stone from the L side."

## 2013-08-28 NOTE — ED Notes (Signed)
MD at bedside. 

## 2013-08-28 NOTE — ED Notes (Signed)
Patient transported to CT 

## 2013-08-28 NOTE — ED Provider Notes (Signed)
CSN: [id number]     Arrival date & time 08/28/13  1041 History   First MD Initiated Contact with Patient 08/28/13 1058     Chief Complaint  Patient presents with  . Flank Pain  . Dysuria   (Consider location/radiation/quality/duration/timing/severity/associated sxs/prior Treatment) HPI Comments: Pt has intermittent RLQ pain radiating to R flank with assoc urinary frequency since this morning.   Patient is a 63 y.o. female presenting with flank pain. The history is provided by the patient. No language interpreter was used.  Flank Pain This is a new problem. The current episode started 3 to 5 hours ago. Episode frequency: waxing/waning. Progression since onset: waxing/waning. Associated symptoms include abdominal pain. Pertinent negatives include no chest pain, no headaches and no shortness of breath. Associated symptoms comments: Urinary frequency . Nothing aggravates the symptoms. Nothing relieves the symptoms. She has tried nothing for the symptoms. The treatment provided no relief.    Past Medical History  Diagnosis Date  . Thyroid disease   . Hypertension   . Acid reflux   . Kidney stones   . Hypothyroidism   . Arthritis    Past Surgical History  Procedure Laterality Date  . Femur closed reduction    . Shoulder surgery    . Abdominal hysterectomy    . Nephrolithotomy  08/15/2012    Procedure: NEPHROLITHOTOMY PERCUTANEOUS;  Surgeon: Sebastian Ache, MD;  Location: [facility];  Service: Urology;  Laterality: Left;  WITH SURGEON ACCESS   . Cystoscopy  08/15/2012    Procedure: CYSTOSCOPY;  Surgeon: Sebastian Ache, MD;  Location: [facility];  Service: Urology;  Laterality: N/A;  . Nephrolithotomy  08/17/2012    Procedure: NEPHROLITHOTOMY PERCUTANEOUS SECOND LOOK;  Surgeon: Sebastian Ache, MD;  Location: [facility];  Service: Urology;  Laterality: Left;  LEFT 2ND STAGE PCNL   Family History  Problem Relation Age of Onset  . Cancer Other    History  Substance Use Topics  . Smoking status:  Former Smoker    Quit date: 08/13/2002  . Smokeless tobacco: Never Used  . Alcohol Use: 0.6 oz/week    1 Glasses of wine per week     Comment: daily-wine   OB History   Grav Para Term Preterm Abortions TAB SAB Ect Mult Living                 Review of Systems  Constitutional: Negative for fever, chills, diaphoresis, activity change, appetite change and fatigue.  HENT: Negative for congestion, facial swelling, rhinorrhea and sore throat.   Eyes: Negative for photophobia and discharge.  Respiratory: Negative for cough, chest tightness and shortness of breath.   Cardiovascular: Negative for chest pain, palpitations and leg swelling.  Gastrointestinal: Positive for abdominal pain. Negative for nausea, vomiting and diarrhea.  Endocrine: Negative for polydipsia and polyuria.  Genitourinary: Positive for dysuria and flank pain. Negative for frequency, difficulty urinating and pelvic pain.  Musculoskeletal: Negative for arthralgias, back pain, neck pain and neck stiffness.  Skin: Negative for color change and wound.  Allergic/Immunologic: Negative for immunocompromised state.  Neurological: Negative for facial asymmetry, weakness, numbness and headaches.  Hematological: Does not bruise/bleed easily.  Psychiatric/Behavioral: Negative for confusion and agitation.    Allergies  Review of patient's allergies indicates no known allergies.  Home Medications   Current Outpatient Rx  Name  Route  Sig  Dispense  Refill  . citalopram (CELEXA) 40 MG tablet   Oral   Take 40 mg by mouth every morning.         Marland Kitchen  esomeprazole (NEXIUM) 40 MG capsule   Oral   Take 40 mg by mouth daily before breakfast.         . fish oil-omega-3 fatty acids 1000 MG capsule   Oral   Take 2 g by mouth 2 (two) times daily.         Marland Kitchen glucosamine-chondroitin 500-400 MG tablet   Oral   Take 2 tablets by mouth daily.          Marland Kitchen levothyroxine (SYNTHROID, LEVOTHROID) 50 MCG tablet   Oral   Take 50 mcg by  mouth every morning.         Marland Kitchen lisinopril (PRINIVIL,ZESTRIL) 10 MG tablet   Oral   Take 10 mg by mouth daily.         Marland Kitchen LYSINE PO   Oral   Take 1 tablet by mouth daily.         . meloxicam (MOBIC) 15 MG tablet   Oral   Take 15 mg by mouth daily as needed for pain.         . Multiple Vitamins-Minerals (MULTIVITAMIN WITH MINERALS) tablet   Oral   Take 1 tablet by mouth daily.         . rosuvastatin (CRESTOR) 20 MG tablet   Oral   Take 20 mg by mouth at bedtime.          . Vitamin D, Ergocalciferol, (DRISDOL) 50000 UNITS CAPS   Oral   Take 50,000 Units by mouth every Thursday.           BP 179/82  Pulse 56  Temp(Src) 97.5 F (36.4 C) (Oral)  Resp 20  SpO2 99% Physical Exam  Constitutional: She is oriented to person, place, and time. She appears well-developed and well-nourished. No distress.  HENT:  Head: Normocephalic and atraumatic.  Mouth/Throat: No oropharyngeal exudate.  Eyes: Pupils are equal, round, and reactive to light.  Neck: Normal range of motion. Neck supple.  Cardiovascular: Normal rate, regular rhythm and normal heart sounds.  Exam reveals no gallop and no friction rub.   No murmur heard. Pulmonary/Chest: Effort normal and breath sounds normal. No respiratory distress. She has no wheezes. She has no rales.  Abdominal: Soft. Bowel sounds are normal. She exhibits no distension and no mass. There is no tenderness. There is no rebound, no guarding and no CVA tenderness.  Musculoskeletal: Normal range of motion. She exhibits no edema and no tenderness.  Neurological: She is alert and oriented to person, place, and time.  Skin: Skin is warm and dry.  Psychiatric: She has a normal mood and affect.    ED Course  Procedures (including critical care time) Labs Review Labs Reviewed  CBC WITH DIFFERENTIAL  BASIC METABOLIC PANEL  URINALYSIS, ROUTINE W REFLEX MICROSCOPIC   Imaging Review No results found.  EKG Interpretation   None        MDM  No diagnosis found. Pt is a 62 y.o. female with Pmhx as above who presents with RLQ pain radiating to R flank, urinary frequency for several hours w/ assoc nausea.  Hx of kidney stones requiring operative intervention in the past.   On PE, VSS, pt in NAD.  Abdominal exam benign, no CVA tenderness.  CBC BMP unremarkable.  Urine not infected.  CT stone study w/ 4mm R UPJ stone with mild R ureteral dilatation.  Pt pain resolved after 1 dose IV morphine.  Will d/c home w/ zofran, flomax, norco for symptomatic control.  Pt can f/u with  her urologist this week as outpt.  Return precautions given for new or worsening symptoms including uncontrolled pain, fever, inability to tolerate PO.          Shanna Cisco, MD 08/28/13 2002

## 2013-08-29 LAB — URINE CULTURE: Colony Count: NO GROWTH

## 2013-11-19 ENCOUNTER — Encounter: Payer: Self-pay | Admitting: Physical Medicine & Rehabilitation

## 2013-11-28 ENCOUNTER — Other Ambulatory Visit (HOSPITAL_COMMUNITY): Payer: Self-pay | Admitting: Family Medicine

## 2013-11-28 DIAGNOSIS — Z1231 Encounter for screening mammogram for malignant neoplasm of breast: Secondary | ICD-10-CM

## 2013-12-26 ENCOUNTER — Ambulatory Visit: Payer: 59 | Admitting: Physical Medicine & Rehabilitation

## 2013-12-27 ENCOUNTER — Ambulatory Visit (HOSPITAL_COMMUNITY): Payer: 59

## 2014-01-01 ENCOUNTER — Ambulatory Visit (HOSPITAL_COMMUNITY)
Admission: RE | Admit: 2014-01-01 | Discharge: 2014-01-01 | Disposition: A | Discharge status: Home / Self Care | Payer: 59 | Source: Ambulatory Visit | Attending: Family Medicine | Admitting: Family Medicine

## 2014-01-01 DIAGNOSIS — Z1231 Encounter for screening mammogram for malignant neoplasm of breast: Secondary | ICD-10-CM | POA: Insufficient documentation

## 2014-01-13 ENCOUNTER — Encounter: Payer: Self-pay | Admitting: Physical Medicine & Rehabilitation

## 2014-01-13 ENCOUNTER — Encounter: Payer: 59 | Attending: Physical Medicine & Rehabilitation

## 2014-01-13 ENCOUNTER — Ambulatory Visit (HOSPITAL_BASED_OUTPATIENT_CLINIC_OR_DEPARTMENT_OTHER): Payer: 59 | Admitting: Physical Medicine & Rehabilitation

## 2014-01-13 VITALS — BP 121/75 | HR 75 | Resp 14 | Ht 67.0 in | Wt 192.4 lb

## 2014-01-13 DIAGNOSIS — J150 Pneumonia due to Klebsiella pneumoniae: Secondary | ICD-10-CM

## 2014-01-13 DIAGNOSIS — G5600 Carpal tunnel syndrome, unspecified upper limb: Secondary | ICD-10-CM

## 2014-01-13 NOTE — Patient Instructions (Signed)
Moderate carpal tunnel Follow up with Dr Thurston HoleWainer

## 2014-01-13 NOTE — Progress Notes (Signed)
EMG performed 01/13/2014.  See EMG report under media tab.

## 2014-04-30 ENCOUNTER — Encounter: Payer: Self-pay | Admitting: Physical Medicine & Rehabilitation

## 2014-07-11 ENCOUNTER — Other Ambulatory Visit: Payer: Self-pay

## 2014-12-16 ENCOUNTER — Other Ambulatory Visit (HOSPITAL_COMMUNITY): Payer: Self-pay | Admitting: Family Medicine

## 2014-12-16 DIAGNOSIS — Z1231 Encounter for screening mammogram for malignant neoplasm of breast: Secondary | ICD-10-CM

## 2015-01-05 ENCOUNTER — Ambulatory Visit (HOSPITAL_COMMUNITY)
Admission: RE | Admit: 2015-01-05 | Discharge: 2015-01-05 | Disposition: A | Discharge status: Home / Self Care | Payer: Medicare Other | Source: Ambulatory Visit | Attending: Family Medicine | Admitting: Family Medicine

## 2015-01-05 DIAGNOSIS — Z1231 Encounter for screening mammogram for malignant neoplasm of breast: Secondary | ICD-10-CM | POA: Insufficient documentation

## 2015-03-23 ENCOUNTER — Other Ambulatory Visit: Payer: Self-pay

## 2015-12-11 ENCOUNTER — Other Ambulatory Visit: Payer: Self-pay

## 2015-12-11 DIAGNOSIS — Z1231 Encounter for screening mammogram for malignant neoplasm of breast: Secondary | ICD-10-CM

## 2016-01-08 ENCOUNTER — Ambulatory Visit
Admission: RE | Admit: 2016-01-08 | Discharge: 2016-01-08 | Disposition: A | Discharge status: Home / Self Care | Payer: Medicare Other | Source: Ambulatory Visit

## 2016-01-08 DIAGNOSIS — Z1231 Encounter for screening mammogram for malignant neoplasm of breast: Secondary | ICD-10-CM

## 2016-03-31 ENCOUNTER — Ambulatory Visit: Payer: Medicare Other | Admitting: Podiatry

## 2016-11-30 ENCOUNTER — Other Ambulatory Visit: Payer: Self-pay | Admitting: Family Medicine

## 2016-11-30 DIAGNOSIS — Z1231 Encounter for screening mammogram for malignant neoplasm of breast: Secondary | ICD-10-CM

## 2017-01-10 ENCOUNTER — Ambulatory Visit
Admission: RE | Admit: 2017-01-10 | Discharge: 2017-01-10 | Disposition: A | Discharge status: Home / Self Care | Payer: Medicare Other | Source: Ambulatory Visit | Attending: Family Medicine | Admitting: Family Medicine

## 2017-01-10 DIAGNOSIS — Z1231 Encounter for screening mammogram for malignant neoplasm of breast: Secondary | ICD-10-CM

## 2017-02-28 ENCOUNTER — Other Ambulatory Visit: Payer: Self-pay | Admitting: Acute Care

## 2017-02-28 DIAGNOSIS — F17210 Nicotine dependence, cigarettes, uncomplicated: Principal | ICD-10-CM

## 2017-03-06 ENCOUNTER — Encounter: Payer: Self-pay | Admitting: Acute Care

## 2017-03-06 ENCOUNTER — Ambulatory Visit (INDEPENDENT_AMBULATORY_CARE_PROVIDER_SITE_OTHER)
Admission: RE | Admit: 2017-03-06 | Discharge: 2017-03-06 | Disposition: A | Discharge status: Home / Self Care | Payer: Medicare Other | Source: Ambulatory Visit | Attending: Acute Care | Admitting: Acute Care

## 2017-03-06 ENCOUNTER — Ambulatory Visit (INDEPENDENT_AMBULATORY_CARE_PROVIDER_SITE_OTHER): Payer: Medicare Other | Admitting: Acute Care

## 2017-03-06 DIAGNOSIS — F17210 Nicotine dependence, cigarettes, uncomplicated: Secondary | ICD-10-CM | POA: Diagnosis not present

## 2017-03-06 DIAGNOSIS — Z87891 Personal history of nicotine dependence: Secondary | ICD-10-CM

## 2017-03-06 NOTE — Progress Notes (Signed)
Shared Decision Making Visit Lung Cancer Screening Program [phone])   Eligibility:  Age 66 y.o.  Pack Years Smoking History Calculation 34-pack-year smoking history (# packs/per year x # years smoked)  Recent History of coughing up blood  no  Unexplained weight loss? no ( >Than 15 pounds within the last 6 months )  Prior History Lung / other cancer no (Diagnosis within the last 5 years already requiring surveillance chest CT Scans).  Smoking Status Current Smoker  Former Smokers: Years since quit:NA  Quit Date: NA  Visit Components:  Discussion included one or more decision making aids. yes  Discussion included risk/benefits of screening. yes  Discussion included potential follow up diagnostic testing for abnormal scans. yes  Discussion included meaning and risk of over diagnosis. yes  Discussion included meaning and risk of False Positives. yes  Discussion included meaning of total radiation exposure. yes  Counseling Included:  Importance of adherence to annual lung cancer LDCT screening. yes  Impact of comorbidities on ability to participate in the program. yes  Ability and willingness to under diagnostic treatment. yes  Smoking Cessation Counseling:  Current Smokers:   Discussed importance of smoking cessation. yes  Information about tobacco cessation classes and interventions provided to patient. yes  Patient provided with "ticket" for LDCT Scan. yes  Symptomatic Patient. no  Counseling  Diagnosis Code: Tobacco Use Z72.0  Asymptomatic Patient yes  Counseling (Intermediate counseling: > three minutes counseling) U0454  Former Smokers:   Discussed the importance of maintaining cigarette abstinence. yes  Diagnosis Code: Personal History of Nicotine Dependence. U98.119  Information about tobacco cessation classes and interventions provided to patient. Yes  Patient provided with "ticket" for LDCT Scan. yes  Written Order for Lung Cancer  Screening with LDCT placed in Epic. Yes (CT Chest Lung Cancer Screening Low Dose W/O CM) [id number] Z12.2-Screening of respiratory organs Z87.891-Personal history of nicotine dependence  I have spent 25 minutes of face to face time with Marie Dominguez discussing the risks and benefits of lung cancer screening. We viewed a power point together that explained in detail the above noted topics. We paused at intervals to allow for questions to be asked and answered to ensure understanding.We discussed that the single most powerful action that she can take to decrease her risk of developing lung cancer is to quit smoking. We discussed whether or not she is ready to commit to setting a quit date. We discussed options for tools to aid in quitting smoking including nicotine replacement therapy, non-nicotine medications, support groups, Quit Smart classes, and behavior modification. We discussed that often times setting smaller, more achievable goals, such as eliminating 1 cigarette a day for a week and then 2 cigarettes a day for a week can be helpful in slowly decreasing the number of cigarettes smoked. This allows for a sense of accomplishment as well as providing a clinical benefit. I gave her the " [name]" card with contact information for community resources, classes, free nicotine replacement therapy, and access to mobile apps, text messaging, and on-line smoking cessation help. I have also given her my card and contact information in the event she needs to contact me. We discussed the time and location of the scan, and that either Marie Dominguez or I will call with the results within 24-48 hours of receiving them. I have offered Marie Dominguez   a copy of the power point we viewed  as a resource in the event they need reinforcement of the  concepts we discussed today in the office. The patient verbalized understanding of all of  the above and had no further questions upon leaving the office. They have my  contact information in the event they have any further questions.  I spent 4 minutes counseling on smoking cessation and the health risks of continued tobacco abuse.  I explained to the patient that there has been a high incidence of coronary artery disease noted on these exams. I explained that this is a non-gated exam therefore degree or severity cannot be determined. This patient is currently on statin therapy. I have asked the patient to follow-up with their PCP regarding any incidental finding of coronary artery disease and management with diet or medication as their PCP  feels is clinically indicated. The patient verbalized understanding of the above and had no further questions upon completion of the visit.      Bevelyn NgoSarah F Laquitha Heslin, NP 03/06/2017

## 2017-03-09 ENCOUNTER — Other Ambulatory Visit: Payer: Self-pay | Admitting: Acute Care

## 2017-03-09 DIAGNOSIS — F17210 Nicotine dependence, cigarettes, uncomplicated: Principal | ICD-10-CM

## 2017-12-11 ENCOUNTER — Other Ambulatory Visit: Payer: Self-pay | Admitting: Family Medicine

## 2017-12-11 DIAGNOSIS — Z1231 Encounter for screening mammogram for malignant neoplasm of breast: Secondary | ICD-10-CM

## 2018-01-16 ENCOUNTER — Ambulatory Visit
Admission: RE | Admit: 2018-01-16 | Discharge: 2018-01-16 | Disposition: A | Discharge status: Home / Self Care | Payer: Medicare Other | Source: Ambulatory Visit | Attending: Family Medicine | Admitting: Family Medicine

## 2018-01-16 DIAGNOSIS — Z1231 Encounter for screening mammogram for malignant neoplasm of breast: Secondary | ICD-10-CM

## 2018-07-07 ENCOUNTER — Emergency Department (HOSPITAL_COMMUNITY)
Admission: EM | Admit: 2018-07-07 | Discharge: 2018-07-07 | Disposition: A | Discharge status: Home / Self Care | Payer: Medicare Other | Attending: Emergency Medicine | Admitting: Emergency Medicine

## 2018-07-07 ENCOUNTER — Emergency Department (HOSPITAL_COMMUNITY): Payer: Medicare Other

## 2018-07-07 ENCOUNTER — Other Ambulatory Visit: Payer: Self-pay

## 2018-07-07 ENCOUNTER — Encounter (HOSPITAL_COMMUNITY): Payer: Self-pay | Admitting: Emergency Medicine

## 2018-07-07 DIAGNOSIS — S51011A Laceration without foreign body of right elbow, initial encounter: Secondary | ICD-10-CM | POA: Insufficient documentation

## 2018-07-07 DIAGNOSIS — Y999 Unspecified external cause status: Secondary | ICD-10-CM | POA: Diagnosis not present

## 2018-07-07 DIAGNOSIS — E039 Hypothyroidism, unspecified: Secondary | ICD-10-CM | POA: Insufficient documentation

## 2018-07-07 DIAGNOSIS — Y9389 Activity, other specified: Secondary | ICD-10-CM | POA: Insufficient documentation

## 2018-07-07 DIAGNOSIS — Z79899 Other long term (current) drug therapy: Secondary | ICD-10-CM | POA: Diagnosis not present

## 2018-07-07 DIAGNOSIS — Z23 Encounter for immunization: Secondary | ICD-10-CM | POA: Diagnosis not present

## 2018-07-07 DIAGNOSIS — F17210 Nicotine dependence, cigarettes, uncomplicated: Secondary | ICD-10-CM | POA: Diagnosis not present

## 2018-07-07 DIAGNOSIS — I10 Essential (primary) hypertension: Secondary | ICD-10-CM | POA: Diagnosis not present

## 2018-07-07 DIAGNOSIS — Y929 Unspecified place or not applicable: Secondary | ICD-10-CM | POA: Insufficient documentation

## 2018-07-07 DIAGNOSIS — W0110XA Fall on same level from slipping, tripping and stumbling with subsequent striking against unspecified object, initial encounter: Secondary | ICD-10-CM | POA: Insufficient documentation

## 2018-07-07 HISTORY — DX: Hyperlipidemia, unspecified: E78.5

## 2018-07-07 MED ORDER — POVIDONE-IODINE 10 % EX SOLN
CUTANEOUS | Status: AC
  Administered 2018-07-07: 2 via TOPICAL
  Filled 2018-07-07: qty 30

## 2018-07-07 MED ORDER — LIDOCAINE HCL (PF) 1 % IJ SOLN
5.0000 mL | Freq: Once | INTRAMUSCULAR | Status: AC
  Administered 2018-07-07: 5 mL via INTRADERMAL

## 2018-07-07 MED ORDER — TETANUS-DIPHTH-ACELL PERTUSSIS 5-2.5-18.5 LF-MCG/0.5 IM SUSP
0.5000 mL | Freq: Once | INTRAMUSCULAR | Status: AC
  Administered 2018-07-07: 0.5 mL via INTRAMUSCULAR
  Filled 2018-07-07: qty 0.5

## 2018-07-07 MED ORDER — LIDOCAINE HCL (PF) 1 % IJ SOLN
INTRAMUSCULAR | Status: AC
  Administered 2018-07-07: 5 mL via INTRADERMAL
  Filled 2018-07-07: qty 5

## 2018-07-07 MED ORDER — POVIDONE-IODINE 10 % EX SOLN
CUTANEOUS | Status: DC | PRN
  Administered 2018-07-07: 2 via TOPICAL

## 2018-07-07 MED ORDER — LIDOCAINE 1% INJECTION FOR CIRCUMCISION
5.0000 mL | INJECTION | Freq: Once | INTRAVENOUS | Status: DC
  Filled 2018-07-07: qty 5

## 2018-07-07 NOTE — ED Notes (Signed)
Fell on concrete around 1730 onto her R elbow  Has open lac to elbow and pain there

## 2018-07-07 NOTE — ED Triage Notes (Signed)
Pt has laceration to right forearm after tripping over dog. Pt fell hitting elbow on concrete. Bleeding controlled at present time.

## 2018-07-07 NOTE — ED Notes (Signed)
HB in to repair 

## 2018-07-07 NOTE — ED Provider Notes (Signed)
[facility] EMERGENCY DEPARTMENT Provider Note   CSN: [id number] Arrival date & time: 07/07/18  1845     History   Chief Complaint Chief Complaint  Patient presents with  . Extremity Laceration    HPI Marie Dominguez is a 67 y.o. female.  Patient states she was playing ball with her great-grandson, when she stumbled over their dog, and hit her elbow on last seen in the area.  The patient noted a laceration with bleeding.  She applied pressure.  Her daughter is Art therapist and also assisted her with stopping the bleeding.  She had some soreness of the elbow, and noted that the laceration was deep and came to the emergency department for evaluation.  The patient is unsure of the date of her last tetanus.  No other injury reported.  The history is provided by the patient.    Past Medical History:  Diagnosis Date  . Acid reflux   . Arthritis   . Hyperlipemia   . Hypertension   . Hypothyroidism   . Kidney stones   . Thyroid disease     Patient Active Problem List   Diagnosis Date Noted  . Carpal tunnel syndrome 01/13/2014  . 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 OTHER ACQUIRED DEFORMITY OF ANKLE AND FOOT OTHER 07/15/2009  . UNEQUAL LEG LENGTH 07/15/2009    Past Surgical History:  Procedure Laterality Date  . ABDOMINAL HYSTERECTOMY    . CYSTOSCOPY  08/15/2012   Procedure: CYSTOSCOPY;  Surgeon: Sebastian Ache, MD;  Location: [facility];  Service: Urology;  Laterality: N/A;  . FEMUR CLOSED REDUCTION    . NEPHROLITHOTOMY  08/15/2012   Procedure: NEPHROLITHOTOMY PERCUTANEOUS;  Surgeon: Sebastian Ache, MD;  Location: [facility];  Service: Urology;  Laterality: Left;  WITH SURGEON ACCESS   . NEPHROLITHOTOMY  08/17/2012   Procedure: NEPHROLITHOTOMY PERCUTANEOUS SECOND LOOK;  Surgeon: Sebastian Ache, MD;  Location: [facility];  Service: Urology;  Laterality: Left;  LEFT 2ND STAGE PCNL  . SHOULDER SURGERY       OB History   None      Home Medications    Prior to  Admission medications   Medication Sig Start Date End Date Taking? Authorizing Provider  citalopram (CELEXA) 40 MG tablet Take 40 mg by mouth every morning.    [provider]  esomeprazole (NEXIUM) 40 MG capsule Take 40 mg by mouth daily before breakfast.    [provider]  fish oil-omega-3 fatty acids 1000 MG capsule Take 2 g by mouth 2 (two) times daily.    [provider]  glucosamine-chondroitin 500-400 MG tablet Take 2 tablets by mouth daily.     [provider]  HYDROcodone-acetaminophen (NORCO) 5-325 MG per tablet Take 1 tablet by mouth every 6 (six) hours as needed for moderate pain. 08/28/13   Toy Cookey, MD  levothyroxine (SYNTHROID, LEVOTHROID) 50 MCG tablet Take 50 mcg by mouth every morning.    [provider]  lisinopril (PRINIVIL,ZESTRIL) 10 MG tablet Take 10 mg by mouth daily.    [provider]  LYSINE PO Take 1 tablet by mouth daily.    [provider]  meloxicam (MOBIC) 15 MG tablet Take 15 mg by mouth daily as needed for pain.    [provider]  Multiple Vitamins-Minerals (MULTIVITAMIN WITH MINERALS) tablet Take 1 tablet by mouth daily.    [provider]  ondansetron (ZOFRAN) 4 MG tablet Take 1 tablet (4 mg total) by mouth every 6 (six) hours.  08/28/13   Toy Cookey, MD  rosuvastatin (CRESTOR) 20 MG tablet Take 20 mg by mouth at bedtime.     [provider]  tamsulosin (FLOMAX) 0.4 MG CAPS capsule Take 1 capsule (0.4 mg total) by mouth daily. 08/28/13   Toy Cookey, MD  Vitamin D, Ergocalciferol, (DRISDOL) 50000 UNITS CAPS Take 50,000 Units by mouth every Thursday.     [provider]    Family History Family History  Problem Relation Age of Onset  . Cancer Other   . Breast cancer Maternal Aunt     Social History Social History   Tobacco Use  . Smoking status: Current Every Day Smoker    Packs/day: 0.50    Years: 34.00    Pack years: 17.00    Last  attempt to quit: 08/13/2002    Years since quitting: 15.9  . Smokeless tobacco: Never Used  Substance Use Topics  . Alcohol use: Yes    Alcohol/week: 1.0 standard drinks    Types: 1 Glasses of wine per week    Comment: daily-wine  . Drug use: No     Allergies   Patient has no known allergies.   Review of Systems Review of Systems  Constitutional: Negative for activity change.       All ROS Neg except as noted in HPI  HENT: Negative for nosebleeds.   Eyes: Negative for photophobia and discharge.  Respiratory: Negative for cough, shortness of breath and wheezing.   Cardiovascular: Negative for chest pain and palpitations.  Gastrointestinal: Negative for abdominal pain and blood in stool.  Genitourinary: Negative for dysuria, frequency and hematuria.  Musculoskeletal: Negative for arthralgias, back pain and neck pain.  Skin: Negative.   Neurological: Negative for dizziness, seizures and speech difficulty.  Psychiatric/Behavioral: Negative for confusion and hallucinations.     Physical Exam Updated Vital Signs Ht 5' 7.5" (1.715 m)   Wt 83.9 kg   BMI 28.55 kg/m   Physical Exam  Constitutional: She is oriented to person, place, and time. She appears well-developed and well-nourished.  Non-toxic appearance.  HENT:  Head: Normocephalic.  Right Ear: Tympanic membrane and external ear normal.  Left Ear: Tympanic membrane and external ear normal.  Eyes: Pupils are equal, round, and reactive to light. EOM and lids are normal.  Neck: Normal range of motion. Neck supple. Carotid bruit is not present.  Cardiovascular: Normal rate, regular rhythm, normal heart sounds, intact distal pulses and normal pulses.  Pulmonary/Chest: Breath sounds normal. No respiratory distress.  Abdominal: Soft. Bowel sounds are normal. There is no tenderness. There is no guarding.  Musculoskeletal: Normal range of motion.       Right elbow: She exhibits laceration. Tenderness found.        Arms: Lymphadenopathy:       Head (right side): No submandibular adenopathy present.       Head (left side): No submandibular adenopathy present.    She has no cervical adenopathy.  Neurological: She is alert and oriented to person, place, and time. She has normal strength. No cranial nerve deficit or sensory deficit.  No motor or sensory deficits of the upper extremities.  Skin: Skin is warm and dry.  Psychiatric: She has a normal mood and affect. Her speech is normal.  Nursing note and vitals reviewed.    ED Treatments / Results  Labs (all labs ordered are listed, but only abnormal results are displayed) Labs Reviewed - No data to display  EKG None  Radiology Dg Elbow Complete Right  Result Date: 07/07/2018 CLINICAL DATA:  Tripped over dog and fell. Hit elbow on concrete. Elbow pain. Initial encounter. EXAM: RIGHT ELBOW - COMPLETE 3+ VIEW COMPARISON:  None. FINDINGS: There is no evidence of fracture, dislocation, or joint effusion. Mild degenerative spurring is seen involving the radial head and medial compartment. Soft tissues are unremarkable. IMPRESSION: No acute findings. Mild degenerative spurring. Electronically Signed   By: Myles Rosenthal M.D.   On: 07/07/2018 20:52    Procedures .Marland KitchenLaceration Repair Date/Time: 07/07/2018 9:24 PM Performed by: Ivery Quale, PA-C Authorized by: Ivery Quale, PA-C   Consent:    Consent obtained:  Verbal   Consent given by:  Patient   Risks discussed:  Infection, pain, poor cosmetic result and poor wound healing Anesthesia (see MAR for exact dosages):    Anesthesia method:  Local infiltration   Local anesthetic:  Lidocaine 1% w/o epi Laceration details:    Location:  Shoulder/arm   Shoulder/arm location:  R elbow   Length (cm):  3.6 Repair type:    Repair type:  Simple Pre-procedure details:    Preparation:  Patient was prepped and draped in usual sterile fashion and imaging obtained to evaluate for foreign bodies Exploration:     Hemostasis achieved with:  Direct pressure   Wound exploration: wound explored through full range of motion     Wound extent: no foreign bodies/material noted, no nerve damage noted, no tendon damage noted and no underlying fracture noted   Treatment:    Area cleansed with:  Betadine   Amount of cleaning:  Standard   Irrigation solution:  Sterile saline Skin repair:    Repair method:  Sutures   Suture size:  4-0   Wound skin closure material used: Vicryl Rapide.   Suture technique:  Simple interrupted   Number of sutures:  7 Approximation:    Approximation:  Close Post-procedure details:    Dressing:  Non-adherent dressing   Patient tolerance of procedure:  Tolerated well, no immediate complications   (including critical care time)  Medications Ordered in ED Medications  povidone-iodine (BETADINE) 10 % external solution (2 application Topical Given 07/07/18 1954)  Tdap (BOOSTRIX) injection 0.5 mL (has no administration in time range)  lidocaine (PF) (XYLOCAINE) 1 % injection 5 mL (5 mLs Intradermal Given 07/07/18 1954)     Initial Impression / Assessment and Plan / ED Course  I have reviewed the triage vital signs and the nursing notes.  Pertinent labs & imaging results that were available during my care of the patient were reviewed by me and considered in my medical decision making (see chart for details).      Final Clinical Impressions(s) / ED Diagnoses  MDM  Patient fell while playing with her great grandson, injured the elbow when she fell over her dog.  Patient sustained a laceration to the right elbow.  X-ray is negative for fracture, dislocation, or foreign body.  The wound was repaired with 7 interrupted sutures of Vicryl Rapide..  Sterile dressing applied.  Patient given instructions to return if any signs of advancing infection.  Her daughter is a Engineer, civil (consulting) and will also help with wound care and monitoring.   Final diagnoses:  Laceration of right elbow, initial  encounter    ED Discharge Orders    None       Ivery Quale, Cordelia Poche 07/07/18 2132    Bethann Berkshire, MD 07/07/18 [phone]

## 2018-07-07 NOTE — ED Notes (Signed)
To rad 

## 2018-07-07 NOTE — ED Notes (Signed)
lidocinae and betadine handoff to H bryant

## 2018-07-07 NOTE — Discharge Instructions (Addendum)
Your wound was repaired with a dissolvable stitch.  These will come out on their own in about 7 to 10 days.  Please keep the area clean and dry.  Please see your primary physician or return to the emergency department if any red streaks going up the arm, unusual fever, increased redness and warmth of the sutured area, or signs of advancing infection.  Use Tylenol every 4 hours, or ibuprofen every 6 hours if needed for soreness.

## 2018-07-11 ENCOUNTER — Other Ambulatory Visit: Payer: Self-pay

## 2018-07-11 DIAGNOSIS — Z720 Tobacco use: Secondary | ICD-10-CM

## 2018-07-30 ENCOUNTER — Ambulatory Visit (INDEPENDENT_AMBULATORY_CARE_PROVIDER_SITE_OTHER)
Admission: RE | Admit: 2018-07-30 | Discharge: 2018-07-30 | Disposition: A | Discharge status: Home / Self Care | Payer: Medicare Other | Source: Ambulatory Visit | Attending: Acute Care | Admitting: Acute Care

## 2018-07-30 DIAGNOSIS — Z720 Tobacco use: Secondary | ICD-10-CM

## 2018-07-30 DIAGNOSIS — Z87891 Personal history of nicotine dependence: Secondary | ICD-10-CM

## 2018-08-03 ENCOUNTER — Other Ambulatory Visit: Payer: Self-pay | Admitting: Acute Care

## 2018-08-03 DIAGNOSIS — Z87891 Personal history of nicotine dependence: Secondary | ICD-10-CM

## 2018-08-03 DIAGNOSIS — Z122 Encounter for screening for malignant neoplasm of respiratory organs: Secondary | ICD-10-CM

## 2018-08-03 DIAGNOSIS — F17210 Nicotine dependence, cigarettes, uncomplicated: Principal | ICD-10-CM

## 2019-03-22 ENCOUNTER — Other Ambulatory Visit: Payer: Self-pay | Admitting: Family Medicine

## 2019-03-22 DIAGNOSIS — Z1231 Encounter for screening mammogram for malignant neoplasm of breast: Secondary | ICD-10-CM

## 2019-05-16 ENCOUNTER — Other Ambulatory Visit: Payer: Self-pay

## 2019-05-16 ENCOUNTER — Ambulatory Visit
Admission: RE | Admit: 2019-05-16 | Discharge: 2019-05-16 | Disposition: A | Discharge status: Home / Self Care | Payer: Medicare Other | Source: Ambulatory Visit | Attending: Family Medicine | Admitting: Family Medicine

## 2019-05-16 DIAGNOSIS — Z1231 Encounter for screening mammogram for malignant neoplasm of breast: Secondary | ICD-10-CM

## 2019-05-20 ENCOUNTER — Other Ambulatory Visit: Payer: Self-pay | Admitting: Family Medicine

## 2019-05-20 DIAGNOSIS — R928 Other abnormal and inconclusive findings on diagnostic imaging of breast: Secondary | ICD-10-CM

## 2019-05-23 ENCOUNTER — Ambulatory Visit: Payer: Medicare Other

## 2019-05-23 ENCOUNTER — Ambulatory Visit
Admission: RE | Admit: 2019-05-23 | Discharge: 2019-05-23 | Disposition: A | Discharge status: Home / Self Care | Payer: Medicare Other | Source: Ambulatory Visit | Attending: Family Medicine | Admitting: Family Medicine

## 2019-05-23 ENCOUNTER — Other Ambulatory Visit: Payer: Self-pay

## 2019-05-23 DIAGNOSIS — R928 Other abnormal and inconclusive findings on diagnostic imaging of breast: Secondary | ICD-10-CM

## 2019-08-19 ENCOUNTER — Inpatient Hospital Stay: Admission: RE | Admit: 2019-08-19 | Payer: Medicare Other | Source: Ambulatory Visit

## 2019-08-26 ENCOUNTER — Inpatient Hospital Stay: Admission: RE | Admit: 2019-08-26 | Payer: Medicare Other | Source: Ambulatory Visit

## 2019-09-02 NOTE — Progress Notes (Signed)
NEUROLOGY CONSULTATION NOTE  Lizzet Hendley Schram MRN: [id number] DOB: 08-30-1951  Referring provider: Shirlean Mylar, MD Primary care provider: Shaune Pollack, MD  Reason for consult:  Balance problems, foot drop  HISTORY OF PRESENT ILLNESS: Marie Dominguez. Barrell is a 68 year old Caucasian female with HTN, hypothyroidism, HLD and tobacco use disorder who presents for problems with balance and foot drop.  History supplemented by referring provider note.  In December 2019, she started limping while on a cruise.  She started using a cane.  Then around August 2020, she started having trouble keeping her balance. In laste September, her balance progressively got worse, requiring a rolling walker.  She noticed that she had a left foot drop.  Her legs feel weak but denies pain or weakness.  After about 20 minutes, she gets tired.  She denies double vision, slurred speech, dysphagia, upper extremity weakness, neck weakness.  Physical therapy was ineffective.  She takes Mobic for arthritic back pain.   In 1966, she fractured her femur in which her right leg became shorter.  She is unable to get an MRI due to a screw left from when she had surgery for the fractured femur.  She has history of low back pain and degenerative disc disease and underwent epidural injections for L4 radiculopathy in 2018.    PAST MEDICAL HISTORY: Past Medical History:  Diagnosis Date  . Acid reflux   . Arthritis   . Hyperlipemia   . Hypertension   . Hypothyroidism   . Kidney stones   . Thyroid disease     PAST SURGICAL HISTORY: Past Surgical History:  Procedure Laterality Date  . ABDOMINAL HYSTERECTOMY    . CYSTOSCOPY  08/15/2012   Procedure: CYSTOSCOPY;  Surgeon: Sebastian Ache, MD;  Location: [facility];  Service: Urology;  Laterality: N/A;  . FEMUR CLOSED REDUCTION    . NEPHROLITHOTOMY  08/15/2012   Procedure: NEPHROLITHOTOMY PERCUTANEOUS;  Surgeon: Sebastian Ache, MD;  Location: [facility];  Service: Urology;  Laterality: Left;  WITH  SURGEON ACCESS   . NEPHROLITHOTOMY  08/17/2012   Procedure: NEPHROLITHOTOMY PERCUTANEOUS SECOND LOOK;  Surgeon: Sebastian Ache, MD;  Location: [facility];  Service: Urology;  Laterality: Left;  LEFT 2ND STAGE PCNL  . SHOULDER SURGERY      MEDICATIONS: Current Outpatient Medications on File Prior to Visit  Medication Sig Dispense Refill  . citalopram (CELEXA) 40 MG tablet Take 40 mg by mouth every morning.    Marland Kitchen esomeprazole (NEXIUM) 40 MG capsule Take 40 mg by mouth daily before breakfast.    . fish oil-omega-3 fatty acids 1000 MG capsule Take 2 g by mouth 2 (two) times daily.    Marland Kitchen glucosamine-chondroitin 500-400 MG tablet Take 2 tablets by mouth daily.     Marland Kitchen HYDROcodone-acetaminophen (NORCO) 5-325 MG per tablet Take 1 tablet by mouth every 6 (six) hours as needed for moderate pain. 20 tablet 0  . levothyroxine (SYNTHROID, LEVOTHROID) 50 MCG tablet Take 50 mcg by mouth every morning.    Marland Kitchen lisinopril (PRINIVIL,ZESTRIL) 10 MG tablet Take 10 mg by mouth daily.    Marland Kitchen LYSINE PO Take 1 tablet by mouth daily.    . meloxicam (MOBIC) 15 MG tablet Take 15 mg by mouth daily as needed for pain.    . Multiple Vitamins-Minerals (MULTIVITAMIN WITH MINERALS) tablet Take 1 tablet by mouth daily.    . ondansetron (ZOFRAN) 4 MG tablet Take 1 tablet (4 mg total) by mouth every 6 (six) hours. 15 tablet 0  .  rosuvastatin (CRESTOR) 20 MG tablet Take 20 mg by mouth at bedtime.     . tamsulosin (FLOMAX) 0.4 MG CAPS capsule Take 1 capsule (0.4 mg total) by mouth daily. 30 capsule 0  . Vitamin D, Ergocalciferol, (DRISDOL) 50000 UNITS CAPS Take 50,000 Units by mouth every Thursday.      No current facility-administered medications on file prior to visit.     ALLERGIES: No Known Allergies  FAMILY HISTORY: Family History  Problem Relation Age of Onset  . Cancer Other   . Breast cancer Maternal Aunt    SOCIAL HISTORY: Social History   Socioeconomic History  . Marital status: Single    Spouse name: Not on file   . Number of children: Not on file  . Years of education: Not on file  . Highest education level: Not on file  Occupational History  . Not on file  Social Needs  . Financial resource strain: Not on file  . Food insecurity    Worry: Not on file    Inability: Not on file  . Transportation needs    Medical: Not on file    Non-medical: Not on file  Tobacco Use  . Smoking status: Current Every Day Smoker    Packs/day: 0.50    Years: 34.00    Pack years: 17.00    Last attempt to quit: 08/13/2002    Years since quitting: 17.0  . Smokeless tobacco: Never Used  Substance and Sexual Activity  . Alcohol use: Yes    Alcohol/week: 1.0 standard drinks    Types: 1 Glasses of wine per week    Comment: daily-wine  . Drug use: No  . Sexual activity: Yes  Lifestyle  . Physical activity    Days per week: Not on file    Minutes per session: Not on file  . Stress: Not on file  Relationships  . Social Musicianconnections    Talks on phone: Not on file    Gets together: Not on file    Attends religious service: Not on file    Active member of club or organization: Not on file    Attends meetings of clubs or organizations: Not on file    Relationship status: Not on file  . Intimate partner violence    Fear of current or ex partner: Not on file    Emotionally abused: Not on file    Physically abused: Not on file    Forced sexual activity: Not on file  Other Topics Concern  . Not on file  Social History Narrative  . Not on file    REVIEW OF SYSTEMS: Constitutional: No fevers, chills, or sweats, no generalized fatigue, change in appetite Eyes: No visual changes, double vision, eye pain Ear, nose and throat: No hearing loss, ear pain, nasal congestion, sore throat Cardiovascular: No chest pain, palpitations Respiratory:  No shortness of breath at rest or with exertion, wheezes GastrointestinaI: No nausea, vomiting, diarrhea, abdominal pain, fecal incontinence Genitourinary:  No dysuria, urinary  retention or frequency Musculoskeletal:  No neck pain, back pain Integumentary: No rash, pruritus, skin lesions Neurological: as above Psychiatric: No depression, insomnia, anxiety Endocrine: No palpitations, fatigue, diaphoresis, mood swings, change in appetite, change in weight, increased thirst Hematologic/Lymphatic:  No purpura, petechiae. Allergic/Immunologic: no itchy/runny eyes, nasal congestion, recent allergic reactions, rashes  PHYSICAL EXAM: Blood pressure (!) 144/81, pulse 83, height 5\' 7"  (1.702 m), weight 165 lb 9.6 oz (75.1 kg), SpO2 95 %. General: No acute distress.  Patient appears well-groomed.  Head:  Normocephalic/atraumatic Eyes:  fundi examined but not visualized Neck: supple, no paraspinal tenderness, full range of motion Back: No paraspinal tenderness Heart: regular rate and rhythm Lungs: Clear to auscultation bilaterally. Vascular: No carotid bruits. Neurological Exam: Mental status: alert and oriented to person, place, and time, recent and remote memory intact, fund of knowledge intact, attention and concentration intact, speech fluent and not dysarthric, language intact. Cranial nerves: CN I: not tested CN II: pupils equal, round and reactive to light, visual fields intact CN III, IV, VI:  full range of motion, no nystagmus, no ptosis CN V: facial sensation intact CN VII: upper and lower face symmetric CN VIII: hearing intact CN IX, X: gag intact, uvula midline CN XI: sternocleidomastoid and trapezius muscles intact CN XII: tongue midline Bulk & Tone: normal, no fasciculations. Motor:  5/5 throughout in upper extremities; 4+/5 right hip flexion, 4-/5 left hip flexion, 4+/5 bilateral hamstrings, 5-/5 bilateral quadriceps, 0/5 ankle dorsiflexion and plantarflexion. Sensation:  Pinprick and vibration sensation intact. Deep Tendon Reflexes:  Mildly brisk in BRs, biceps, left patellar, absent in ankles, toes down-going in right foot, equivocal in left foot.  throughout Finger to nose testing:  Without dysmetria.  Gait:  Shuffling, steppage.  Romberg positive.  IMPRESSION: Progressive bilateral lower extremity weakness.  Because she has slightly brisk reflexes, I would like to evaluate for a CNS etiology involving the spine.  If unremarkable, we would need to assess for PNS or motor neuron disease.  Given the bilateral symptoms, a primary intracranial abnormality is not suspected at this time.  PLAN: 1.  CT of cervical/thoracic/lumbar spine with and without contrast (prefer MRI but reportedly contraindicated due to a pin in her femur) 2.  Check labs:  ANA, Sed Rate, B12, TSH, Copper, CK, aldolase 3.  Will order Home Health assessment for recommendations for safety and prevention of falls in her home. 4.  Further recommendations pending results.  If CT unremarkable, will check NCV-EMG. 5.  Follow up after testing.  Thank you for allowing me to take part in the care of this patient.  Metta Clines, DO  CC: Maurice Small, MD

## 2019-09-03 ENCOUNTER — Other Ambulatory Visit: Payer: Self-pay

## 2019-09-03 ENCOUNTER — Other Ambulatory Visit (INDEPENDENT_AMBULATORY_CARE_PROVIDER_SITE_OTHER): Payer: Medicare Other

## 2019-09-03 ENCOUNTER — Ambulatory Visit: Payer: Medicare Other | Admitting: Neurology

## 2019-09-03 ENCOUNTER — Encounter: Payer: Self-pay | Admitting: Neurology

## 2019-09-03 VITALS — BP 144/81 | HR 83 | Ht 67.0 in | Wt 165.6 lb

## 2019-09-03 DIAGNOSIS — R292 Abnormal reflex: Secondary | ICD-10-CM | POA: Diagnosis not present

## 2019-09-03 DIAGNOSIS — M21372 Foot drop, left foot: Secondary | ICD-10-CM | POA: Diagnosis not present

## 2019-09-03 DIAGNOSIS — M21371 Foot drop, right foot: Secondary | ICD-10-CM

## 2019-09-03 DIAGNOSIS — R29898 Other symptoms and signs involving the musculoskeletal system: Secondary | ICD-10-CM

## 2019-09-03 NOTE — Patient Instructions (Addendum)
1.  First, we will check MRI of cervical/thoracic/lumbar spine with and without contrast. 2.  We will also check some labs:  ANA, Sed rate, B12, TSH, copper, zinc, CK, aldolase 3.  Further recommendations pending these results. 4.  I will also have a home health agency come to your home to make suggestions regarding making your home more safe from falls (such as in the bathroom). 5.  Follow up after testing.  Please call [phone] if you have not heard from [facility] regarding your CT scan appt.   Your provider has requested that you have labwork completed today. Please go to [facility] ([id number]) on the second floor of this building before leaving the office today. You do not need to check in. If you are not called within 15 minutes please check with the front desk.

## 2019-09-04 LAB — VITAMIN B12: Vitamin B-12: 629 pg/mL (ref 211–911)

## 2019-09-04 LAB — CK: Total CK: 106 U/L (ref 7–177)

## 2019-09-04 LAB — TSH: TSH: 0.7 u[IU]/mL (ref 0.35–4.50)

## 2019-09-05 LAB — ALDOLASE: Aldolase: 4.8 U/L (ref <=8.1)

## 2019-09-05 LAB — ANA: Anti Nuclear Antibody (ANA): NEGATIVE

## 2019-09-05 LAB — ZINC: Zinc: 58 ug/dL — ABNORMAL LOW (ref 60–130)

## 2019-09-05 LAB — COPPER, SERUM: Copper: 138 ug/dL (ref 70–175)

## 2019-09-06 ENCOUNTER — Inpatient Hospital Stay: Admission: RE | Admit: 2019-09-06 | Payer: Medicare Other | Source: Ambulatory Visit

## 2019-09-06 ENCOUNTER — Telehealth: Payer: Self-pay | Admitting: Neurology

## 2019-09-06 NOTE — Progress Notes (Signed)
No authorization needed

## 2019-09-06 NOTE — Progress Notes (Signed)
Please contact insurance for CT authorization.

## 2019-09-06 NOTE — Telephone Encounter (Signed)
error 

## 2019-09-06 NOTE — Progress Notes (Signed)
Patient aware of results and recommendations. °

## 2019-09-24 ENCOUNTER — Telehealth: Payer: Self-pay

## 2019-09-24 NOTE — Telephone Encounter (Signed)
Good morning, I was looking ahead at our CT scheduled and noticed that The attached patient is scheduled for a CT C/T/L spine without and with contrast. I realize that she has a pin in her femur from 1966, per the MRI tech and the Radiologist this patient can have an MRI without any issues. Per the MRI tech she will only need an MRI without contrast unless she has a history of cancer and then she would be an MRI without and with, I did not see any documented history of cancer. Do you want to change the orders to MRI C/T/L spine without contrast. Per the Radiologist, this would be a much better exam, as I read you feel the same. Let me know what you would like to do. I am here to help. You may call me at [phone] if you have any questions.

## 2019-09-26 ENCOUNTER — Ambulatory Visit (HOSPITAL_COMMUNITY): Payer: Medicare Other

## 2019-09-26 ENCOUNTER — Ambulatory Visit (HOSPITAL_COMMUNITY): Admission: RE | Admit: 2019-09-26 | Payer: Medicare Other | Source: Ambulatory Visit

## 2019-09-29 NOTE — Telephone Encounter (Signed)
We can order the MRIs without contrast.

## 2019-09-30 ENCOUNTER — Other Ambulatory Visit: Payer: Self-pay

## 2019-09-30 DIAGNOSIS — R292 Abnormal reflex: Secondary | ICD-10-CM

## 2019-09-30 DIAGNOSIS — R29898 Other symptoms and signs involving the musculoskeletal system: Secondary | ICD-10-CM

## 2019-09-30 DIAGNOSIS — M21371 Foot drop, right foot: Secondary | ICD-10-CM

## 2019-09-30 NOTE — Telephone Encounter (Signed)
Orders changed to MRI of cervical thoracic and spine. With out contrast

## 2019-10-17 ENCOUNTER — Ambulatory Visit (HOSPITAL_COMMUNITY)
Admission: RE | Admit: 2019-10-17 | Discharge: 2019-10-17 | Disposition: A | Discharge status: Home / Self Care | Payer: Medicare Other | Source: Ambulatory Visit | Attending: Neurology | Admitting: Neurology

## 2019-10-17 ENCOUNTER — Other Ambulatory Visit: Payer: Self-pay

## 2019-10-17 DIAGNOSIS — R292 Abnormal reflex: Secondary | ICD-10-CM | POA: Diagnosis present

## 2019-10-17 DIAGNOSIS — M21372 Foot drop, left foot: Secondary | ICD-10-CM

## 2019-10-17 DIAGNOSIS — R29898 Other symptoms and signs involving the musculoskeletal system: Secondary | ICD-10-CM

## 2019-10-17 DIAGNOSIS — M21371 Foot drop, right foot: Secondary | ICD-10-CM | POA: Diagnosis present

## 2019-10-21 ENCOUNTER — Telehealth: Payer: Self-pay | Admitting: Neurology

## 2019-10-21 ENCOUNTER — Other Ambulatory Visit: Payer: Self-pay

## 2019-10-21 DIAGNOSIS — R29898 Other symptoms and signs involving the musculoskeletal system: Secondary | ICD-10-CM

## 2019-10-21 DIAGNOSIS — M2170 Unequal limb length (acquired), unspecified site: Secondary | ICD-10-CM

## 2019-10-21 NOTE — Telephone Encounter (Signed)
Marie Dominguez scheduled her NCS but also wanted Dr. Everlena Cooper to know that for about 2 weeks now she has had a burning sensation in her legs like she did with a pinched nerve. Thank You

## 2019-10-21 NOTE — Telephone Encounter (Signed)
The nerve study is scheduled on 11/05/19. The test can confirm diagnosis and help with treatment plan at that time.

## 2019-11-05 ENCOUNTER — Ambulatory Visit (INDEPENDENT_AMBULATORY_CARE_PROVIDER_SITE_OTHER): Payer: Medicare Other | Admitting: Neurology

## 2019-11-05 ENCOUNTER — Other Ambulatory Visit: Payer: Self-pay

## 2019-11-05 DIAGNOSIS — R29898 Other symptoms and signs involving the musculoskeletal system: Secondary | ICD-10-CM | POA: Diagnosis not present

## 2019-11-05 NOTE — Procedures (Signed)
[facility]  [address] [place], [address]  [place], [place] [address] Tel: [phone] Fax:  [phone] Test Date:  11/05/2019  Patient: Marie Dominguez DOB: October 07, 1950 Physician: Nita Sickle, DO  Sex: Female Height: 5\' 7"  Ref Phys: , DO  ID#: Shon Millet Temp: 31.0C Technician:    Patient Complaints: This is a 69 year old female referred for evaluation of bilateral leg weakness, left foot drop, and imbalance.  NCV & EMG Findings: Extensive electrodiagnostic testing of the right lower extremity and additional studies of the left shows:  1. Bilateral sural and superficial peroneal sensory responses are within normal limits. 2. All motor responses including bilateral peroneal and tibial nerves are absent, trace motor amplitude at the right tibialis anterior. 3. Bilateral tibial H reflex studies are absent. 4. Diffuse active on chronic motor axonal loss changes are seen affecting nearly all the tested muscles in the lower extremities (L3-S1 nerve roots/segments), including the lumbosacral paraspinal muscles 5. Active denervation is present in the lower thoracic paraspinal muscles. 6. Fasciculation potentials are seen in 4 of 13 tested muscles.  Impression: The electrophysiologic findings are most suggestive of a severe, active on chronic, multilevel intraspinal canal lesion process affecting the L3-S1 nerve roots/segment bilaterally and lower thoracic level (i.e. radiculopathy, disorder of anterior horn cells, etc).   There is no evidence of a sensorimotor polyneuropathy or myopathy affecting the lower extremities.   ___________________________ 64, DO    Nerve Conduction Studies Anti Sensory Summary Table   Site NR Peak (ms) Norm Peak (ms) P-T Amp (V) Norm P-T Amp  Left Sup Peroneal Anti Sensory (Ant Lat Mall)  31C  12 cm    2.7 <4.6 20.3 >3  Right Sup Peroneal Anti Sensory (Ant Lat Mall)  31C  12 cm    2.7 <4.6 20.9 >3  Left Sural Anti Sensory (Lat  Mall)  31C  Calf    3.7 <4.6 16.4 >3  Right Sural Anti Sensory (Lat Mall)  31C  Calf    3.4 <4.6 16.7 >3   Motor Summary Table   Site NR Onset (ms) Norm Onset (ms) O-P Amp (mV) Norm O-P Amp Site1 Site2 Delta-0 (ms) Dist (cm) Vel (m/s) Norm Vel (m/s)  Left Peroneal Motor (Ext Dig Brev)  31C  Ankle NR  <6.0  >2.5 B Fib Ankle  0.0  >40  B Fib NR     Poplt B Fib  0.0  >40  Poplt NR            Right Peroneal Motor (Ext Dig Brev)  31C  Ankle NR  <6.0  >2.5 B Fib Ankle  0.0  >40  B Fib NR     Poplt B Fib  0.0  >40  Poplt NR            Left Peroneal TA Motor (Tib Ant)  31C  Fib Head NR  <4.5  >3 Poplit Fib Head  0.0  >40  Poplit NR            Right Peroneal TA Motor (Tib Ant)  31C  Fib Head    4.1 <4.5 0.5 >3 Poplit Fib Head 1.8 8.0 44 >40  Poplit    5.9  0.5         Left Tibial Motor (Abd Hall Brev)  31C  Ankle NR  <6.0  >4 Knee Ankle  0.0  >40  Knee NR            Right Tibial Motor (Abd Harvard  Brev)  31C  Ankle NR  <6.0  >4 Knee Ankle  0.0  >40  Knee NR             H Reflex Studies   NR H-Lat (ms) Lat Norm (ms) L-R H-Lat (ms)  Left Tibial (Gastroc)  31C  NR  <35   Right Tibial (Gastroc)  31C  NR  <35    EMG   Side Muscle Ins Act Fibs Psw Fasc Number Recrt Dur Dur. Amp Amp. Poly Poly. Comment  Left AntTibialis Nml 1+ Nml 1+ NE - - - - - - - ATR  Left Gastroc Nml 1+ Nml Nml SMU Rapid All 1+ All 1+ All 1+ N/A  Left RectFemoris Nml 1+ Nml 1+ SMU Rapid All 1+ All 1+ All 1+ ATR  Left GluteusMed Nml 1+ Nml Nml 1- Rapid Few 1+ Few 1+ Few 1+ N/A  Left BicepsFemS Nml 1+ Nml Nml 1- Rapid Some 1+ Some 1+ Some 1+ N/A  Right T11 Parasp Nml 1+ Nml Nml NE - - - - - - - N/A  Right Lumbo Parasp Low Nml 1+ Nml Nml NE - - - - - - - N/A  Right RectFemoris Nml 2+ Nml Nml 3- Rapid All 1+ All 1+ All 1+ N/A  Right AntTibialis Nml 1+ Nml 1+ SMU Rapid All 1+ All 1+ All 1+ ATR  Right Gastroc Nml 1+ Nml 1+ SMU Rapid Many 1+ Many 1+ - - N/A  Right Flex Dig Long Nml Nml Nml Nml 3- Rapid Many 1+  Many 1+ Nml Nml N/A  Right BicepsFemS Nml Nml Nml Nml Nml Nml Nml Nml Nml Nml Nml Nml N/A  Right GluteusMed Nml Nml Nml Nml Nml Nml Nml Nml Nml Nml Nml Nml N/A      Waveforms:

## 2019-11-08 ENCOUNTER — Encounter: Payer: Self-pay | Admitting: Neurology

## 2019-11-08 ENCOUNTER — Other Ambulatory Visit: Payer: Self-pay

## 2019-11-08 ENCOUNTER — Ambulatory Visit (INDEPENDENT_AMBULATORY_CARE_PROVIDER_SITE_OTHER): Payer: Medicare Other | Admitting: Neurology

## 2019-11-08 VITALS — BP 131/78 | HR 74 | Resp 18 | Ht 67.5 in | Wt 151.0 lb

## 2019-11-08 DIAGNOSIS — G1220 Motor neuron disease, unspecified: Secondary | ICD-10-CM | POA: Diagnosis not present

## 2019-11-08 NOTE — Patient Instructions (Signed)
We will finish the nerve conduction testing on Tuesday.  I have scheduled you for an office follow up next Friday to discuss further.

## 2019-11-08 NOTE — Progress Notes (Signed)
NEUROLOGY FOLLOW UP OFFICE NOTE  Marie Dominguez [id number]  HISTORY OF PRESENT ILLNESS: Marie Dominguez. Marie Dominguez is a 69 year old Caucasian female with HTN, hypothyroidism, HLD and tobacco use disorder who follows up for progressive lower extremity weakness.  MRI of spine personally reviewed.  She is accompanied by her friend.  PDATE: She underwent workup: 09/03/2019 LABS:  ANA negative; CK 106; aldolase 4.8; TSH 0.70; B12 629; copper 138; zinc 58. 10/17/2019 MRI CERVICAL WO:  Multilevel degenerative changes with advanced foraminal stenosis but only mild canal stenosis with no abnormality of the cord to suggest myelopathy. 10/17/2019 MRI THORACIC WO:  Mild degenerative changes but no high-grade canal stenosis or cord impingement. 10/17/2019 MRI LUMBAR WO:  Degenerative changes at L2-3 level with mild canal stenosis, left-greater than right subarticular recess stenosis, and mild-to-moderate left foraminal stenosis; mild diffuse disc bulge at L4-L5 with mild canal stenosis and bilateral subarticular recess stenosis; and mild diffuse disc bulge at L5-S1 with moderate left foraminal stenosis but overall unremarkable. 11/05/2019 NCV-EMG LOWER EXTREMITIES:  "severe active on chronic, multilevel intraspinal canal lesion process affecting the L3-S1 nerve roots/segments bilaterally and lower thoracic level ( i.e. radiculopathy, disorder of anterior horn cells, etc).  There is no evidence of a sensorimotor polyneuropathy or myopathy affecting the lower extremities."  HISTORY: In December 2019, she started limping while on a cruise.  She started using a cane.  Then around August 2020, she started having trouble keeping her balance. In late September, her balance progressively got worse, requiring a rolling walker.  She noticed that she had a left foot drop.  Her legs feel weak but denies pain or weakness.  After about 20 minutes, she gets tired.  She denies double vision, slurred speech, dysphagia, upper extremity  weakness, neck weakness.  Physical therapy was ineffective.  She takes Mobic for arthritic back pain.   In 1966, she fractured her femur in which her right leg became shorter.  She is unable to get an MRI due to a screw left from when she had surgery for the fractured femur.  She has history of low back pain and degenerative disc disease and underwent epidural injections for L4 radiculopathy in 2018.    PAST MEDICAL HISTORY: Past Medical History:  Diagnosis Date  . Acid reflux   . Arthritis   . Hyperlipemia   . Hypertension   . Hypothyroidism   . Kidney stones   . Thyroid disease     MEDICATIONS: Current Outpatient Medications on File Prior to Visit  Medication Sig Dispense Refill  . citalopram (CELEXA) 40 MG tablet Take 40 mg by mouth every morning.    . fish oil-omega-3 fatty acids 1000 MG capsule Take 2 g by mouth 2 (two) times daily.    Marland Kitchen glucosamine-chondroitin 500-400 MG tablet Take 2 tablets by mouth daily.     Marland Kitchen levothyroxine (SYNTHROID, LEVOTHROID) 50 MCG tablet Take 50 mcg by mouth every morning.    Marland Kitchen lisinopril (PRINIVIL,ZESTRIL) 10 MG tablet Take 10 mg by mouth daily.    Marland Kitchen LYSINE PO Take 1 tablet by mouth daily.    . meloxicam (MOBIC) 15 MG tablet Take 15 mg by mouth daily as needed for pain.    . Multiple Vitamins-Minerals (MULTIVITAMIN WITH MINERALS) tablet Take 1 tablet by mouth daily.    . Vitamin D, Ergocalciferol, (DRISDOL) 50000 UNITS CAPS Take 50,000 Units by mouth every Thursday.      No current facility-administered medications on file prior to visit.  ALLERGIES: No Known Allergies  FAMILY HISTORY: Family History  Problem Relation Age of Onset  . Cancer Other   . Breast cancer Maternal Aunt    SOCIAL HISTORY: Social History   Socioeconomic History  . Marital status: Single    Spouse name: Not on file  . Number of children: 1  . Years of education: Not on file  . Highest education level: High school graduate  Occupational History  . Not on  file  Tobacco Use  . Smoking status: Current Every Day Smoker    Packs/day: 0.50    Years: 34.00    Pack years: 17.00    Last attempt to quit: 08/13/2002    Years since quitting: 17.2  . Smokeless tobacco: Never Used  Substance and Sexual Activity  . Alcohol use: Yes    Alcohol/week: 1.0 standard drinks    Types: 1 Glasses of wine per week    Comment: daily-wine  . Drug use: No  . Sexual activity: Yes  Other Topics Concern  . Not on file  Social History Narrative   Single   1 child   Right handed   Drinks coffee, soda once a week mostly water   Social Determinants of Health   Financial Resource Strain:   . Difficulty of Paying Living Expenses: Not on file  Food Insecurity:   . Worried About Charity fundraiser in the Last Year: Not on file  . Ran Out of Food in the Last Year: Not on file  Transportation Needs:   . Lack of Transportation (Medical): Not on file  . Lack of Transportation (Non-Medical): Not on file  Physical Activity:   . Days of Exercise per Week: Not on file  . Minutes of Exercise per Session: Not on file  Stress:   . Feeling of Stress : Not on file  Social Connections:   . Frequency of Communication with Friends and Family: Not on file  . Frequency of Social Gatherings with Friends and Family: Not on file  . Attends Religious Services: Not on file  . Active Member of Clubs or Organizations: Not on file  . Attends Archivist Meetings: Not on file  . Marital Status: Not on file  Intimate Partner Violence:   . Fear of Current or Ex-Partner: Not on file  . Emotionally Abused: Not on file  . Physically Abused: Not on file  . Sexually Abused: Not on file    REVIEW OF SYSTEMS: Constitutional: No fevers, chills, or sweats, no generalized fatigue, change in appetite Eyes: No visual changes, double vision, eye pain Ear, nose and throat: No hearing loss, ear pain, nasal congestion, sore throat Cardiovascular: No chest pain,  palpitations Respiratory:  No shortness of breath at rest or with exertion, wheezes GastrointestinaI: No nausea, vomiting, diarrhea, abdominal pain, fecal incontinence Genitourinary:  No dysuria, urinary retention or frequency Musculoskeletal:  No neck pain, back pain Integumentary: No rash, pruritus, skin lesions Neurological: as above Psychiatric: No depression, insomnia, anxiety Endocrine: No palpitations, fatigue, diaphoresis, mood swings, change in appetite, change in weight, increased thirst Hematologic/Lymphatic:  No purpura, petechiae. Allergic/Immunologic: no itchy/runny eyes, nasal congestion, recent allergic reactions, rashes  PHYSICAL EXAM: There were no vitals taken for this visit. General: No acute distress.  Patient appears well-groomed.   Head:  Normocephalic/atraumatic Eyes:  Fundi examined but not visualized Neck: supple, no paraspinal tenderness, full range of motion Heart:  Regular rate and rhythm Lungs:  Clear to auscultation bilaterally Back: No paraspinal tenderness Neurological  Exam: alert and oriented.  Speech fluent and not dysarthric, language intact.   IMPRESSION: High suspicion for motor neuron disease based on NCV-EMG findings.  Labs and MRI are negative for alternative diagnoses.   PLAN: To meet [place] criteria, we will perform NCV-EMG of the cervical region and possibly testing involvement of bulbar muscles if needed.  I will have her follow up with me soon afterwards to discuss plan.    I answered all questions to the best of my ability.  Total time spent with patient:  35 minutes  Shon Millet, DO  CC: Shirlean Mylar, MD

## 2019-11-12 ENCOUNTER — Other Ambulatory Visit: Payer: Self-pay

## 2019-11-12 ENCOUNTER — Ambulatory Visit (INDEPENDENT_AMBULATORY_CARE_PROVIDER_SITE_OTHER): Payer: Medicare Other | Admitting: Neurology

## 2019-11-12 DIAGNOSIS — G1220 Motor neuron disease, unspecified: Secondary | ICD-10-CM | POA: Diagnosis not present

## 2019-11-13 ENCOUNTER — Encounter: Payer: Self-pay | Admitting: Neurology

## 2019-11-14 NOTE — Progress Notes (Signed)
Virtual Visit via Video Note The purpose of this virtual visit is to provide medical care while limiting exposure to the novel coronavirus.    Consent was obtained for video visit:  Yes.   Answered questions that patient had about telehealth interaction:  Yes.   I discussed the limitations, risks, security and privacy concerns of performing an evaluation and management service by telemedicine. I also discussed with the patient that there may be a patient responsible charge related to this service. The patient expressed understanding and agreed to proceed.  Pt location: Home Physician Location: office Name of referring provider:  Shirlean Mylar, MD I connected with Marie Dominguez at patients initiation/request on 11/15/2019 at 11:10 AM EST by video enabled telemedicine application and verified that I am speaking with the correct person using two identifiers. Pt MRN:  [id number] Pt DOB:  [birth year] Video Participants:  Marie Dominguez;  Her daughter; her friend   History of Present Illness:  Marie Dominguez is a 69 year old Caucasian female with HTN, hypothyroidism, HLD and tobacco use disorder who follows up to discuss completed EMG.  UPDATE: NCV-EMG of cervical and thoracic regions did reveal some denervation but not as extensive as in the lumbar region.  Bulbar muscles did not demonstrate denervation.  HISTORY: In December 2019, she started limpingwhile on a cruise. She started using a cane. Then around August 2020, she started having trouble keeping her balance. In late September, her balance progressively got worse, requiring a rolling walker. She noticed that she had a left foot drop. Her legs feel weak but denies pain or weakness. After about 20 minutes, she gets tired. She denies double vision, slurred speech, dysphagia, upper extremity weakness, neck weakness.Physical therapywas ineffective. She takes Mobic for arthritic back pain.   In 1966, she fractured her femur in which  herrightleg became shorter. She is unable to get an MRI due to a screw left from when she had surgery for the fractured femur.She has history of low back painand degenerative disc diseaseand underwent epidural injections for L4 radiculopathy in 2018.   She underwent workup: 09/03/2019 LABS:  ANA negative; CK 106; aldolase 4.8; TSH 0.70; B12 629; copper 138; zinc 58. 10/17/2019 MRI CERVICAL WO:  Multilevel degenerative changes with advanced foraminal stenosis but only mild canal stenosis with no abnormality of the cord to suggest myelopathy. 10/17/2019 MRI THORACIC WO:  Mild degenerative changes but no high-grade canal stenosis or cord impingement. 10/17/2019 MRI LUMBAR WO:  Degenerative changes at L2-3 level with mild canal stenosis, left-greater than right subarticular recess stenosis, and mild-to-moderate left foraminal stenosis; mild diffuse disc bulge at L4-L5 with mild canal stenosis and bilateral subarticular recess stenosis; and mild diffuse disc bulge at L5-S1 with moderate left foraminal stenosis but overall unremarkable. 11/05/2019 NCV-EMG LOWER EXTREMITIES:  "severe active on chronic, multilevel intraspinal canal lesion process affecting the L3-S1 nerve roots/segments bilaterally and lower thoracic level ( i.e. radiculopathy, disorder of anterior horn cells, etc).  There is no evidence of a sensorimotor polyneuropathy or myopathy affecting the lower extremities."  Past Medical History: Past Medical History:  Diagnosis Date  . Acid reflux   . Arthritis   . Hyperlipemia   . Hypertension   . Hypothyroidism   . Kidney stones   . Thyroid disease     Medications: Outpatient Encounter Medications as of 11/15/2019  Medication Sig Note  . citalopram (CELEXA) 40 MG tablet Take 40 mg by mouth every morning.   . fish oil-omega-3 fatty acids  1000 MG capsule Take 2 g by mouth 2 (two) times daily.   Marland Kitchen glucosamine-chondroitin 500-400 MG tablet Take 2 tablets by mouth daily.    Marland Kitchen  levothyroxine (SYNTHROID, LEVOTHROID) 50 MCG tablet Take 50 mcg by mouth every morning.   Marland Kitchen lisinopril (PRINIVIL,ZESTRIL) 10 MG tablet Take 10 mg by mouth daily.   Marland Kitchen LYSINE PO Take 1 tablet by mouth daily.   . meloxicam (MOBIC) 15 MG tablet Take 15 mg by mouth daily as needed for pain.   . Multiple Vitamins-Minerals (MULTIVITAMIN WITH MINERALS) tablet Take 1 tablet by mouth daily. 08/28/2013: .   Marland Kitchen Vitamin D, Ergocalciferol, (DRISDOL) 50000 UNITS CAPS Take 50,000 Units by mouth every Thursday.     No facility-administered encounter medications on file as of 11/15/2019.    Allergies: No Known Allergies  Family History: Family History  Problem Relation Age of Onset  . Cancer Other   . Breast cancer Maternal Aunt     Social History: Social History   Socioeconomic History  . Marital status: Single    Spouse name: Not on file  . Number of children: 1  . Years of education: Not on file  . Highest education level: High school graduate  Occupational History  . Not on file  Tobacco Use  . Smoking status: Current Every Day Smoker    Packs/day: 0.50    Years: 34.00    Pack years: 17.00    Last attempt to quit: 08/13/2002    Years since quitting: 17.2  . Smokeless tobacco: Never Used  Substance and Sexual Activity  . Alcohol use: Yes    Alcohol/week: 1.0 standard drinks    Types: 1 Glasses of wine per week    Comment: daily-wine  . Drug use: No  . Sexual activity: Yes  Other Topics Concern  . Not on file  Social History Narrative   Single   1 child   Right handed   Drinks coffee, soda once a week mostly water   Social Determinants of Health   Financial Resource Strain:   . Difficulty of Paying Living Expenses: Not on file  Food Insecurity:   . Worried About Programme researcher, broadcasting/film/video in the Last Year: Not on file  . Ran Out of Food in the Last Year: Not on file  Transportation Needs:   . Lack of Transportation (Medical): Not on file  . Lack of Transportation (Non-Medical):  Not on file  Physical Activity:   . Days of Exercise per Week: Not on file  . Minutes of Exercise per Session: Not on file  Stress:   . Feeling of Stress : Not on file  Social Connections:   . Frequency of Communication with Friends and Family: Not on file  . Frequency of Social Gatherings with Friends and Family: Not on file  . Attends Religious Services: Not on file  . Active Member of Clubs or Organizations: Not on file  . Attends Banker Meetings: Not on file  . Marital Status: Not on file  Intimate Partner Violence:   . Fear of Current or Ex-Partner: Not on file  . Emotionally Abused: Not on file  . Physically Abused: Not on file  . Sexually Abused: Not on file    Observations/Objective:   There were no vitals taken for this visit. No acute distress.  Alert and oriented.  Speech fluent and not dysarthric.  Language intact.  Eyes orthophoric on primary gaze.  Face symmetric.  Assessment and Plan:  Amyotrophic Lateral Sclerosis (ALS).  Denervation noted in 3 distinct regions (cervical, thoracic and lumbar).  Denervation not as active in cervical and thoracic regions but her disease is ascending and primarily affecting her legs at this time.  1.  I will refer her to the [facility] at [facility]. 2.  She will follow up with me in 6 months.  I answered all questions to the best of my ability.  Follow Up Instructions:    -I discussed the assessment and treatment plan with the patient. The patient was provided an opportunity to ask questions and all were answered. The patient agreed with the plan and demonstrated an understanding of the instructions.   The patient was advised to call back or seek an in-person evaluation if the symptoms worsen or if the condition fails to improve as anticipated.   Dudley Major, DO

## 2019-11-15 ENCOUNTER — Telehealth: Payer: Self-pay

## 2019-11-15 ENCOUNTER — Telehealth (INDEPENDENT_AMBULATORY_CARE_PROVIDER_SITE_OTHER): Payer: Medicare Other | Admitting: Neurology

## 2019-11-15 ENCOUNTER — Other Ambulatory Visit: Payer: Self-pay

## 2019-11-15 DIAGNOSIS — G1221 Amyotrophic lateral sclerosis: Secondary | ICD-10-CM

## 2019-11-15 NOTE — Telephone Encounter (Signed)
Sent referal and faxed notes, etc, to [facility] ALS clinic fax number [phone]. Phone [phone].

## 2019-11-15 NOTE — Telephone Encounter (Signed)
-----   Message from Drema Dallas, DO sent at 11/15/2019 11:35 AM EST ----- I would like to refer Ms. Rogerson to the ALS clinic at [facility]

## 2019-11-18 ENCOUNTER — Encounter: Payer: Self-pay | Admitting: Neurology

## 2019-11-18 NOTE — Procedures (Signed)
[facility]  [address]  [place], [place] [address] Tel: [phone] Fax:  [phone] Test Date:  11/12/2019  Patient: Marie Dominguez DOB: 10-05-1950 Physician: Narda Amber, DO  Sex: Female Height: 5\' 7"  Ref Phys: Metta Clines, DO  ID#: [id number] Temp: 34.0C Technician:    Patient Complaints: This is a 69 year old female referred for evaluation generalized muscle weakness and fasciculations, concerning for motor neuron disease.  NCV & EMG Findings: Extensive electrodiagnostic testing of the right upper extremity, upper thoracic paraspinal muscles, bulbar muscles, and additional studies of the left shows:  1. Right median sensory responses are within normal limits. 2. Right median and ulnar motor responses are within normal limits. 3. Chronic motor axonal loss changes are seen affecting the C8-T1 myotomes with active denervation in bilateral abductor digiti minimi muscles.  Fasciculation potentials are seen in 5 of the 18 tested muscles. 4. Active motor axonal loss changes are seen at the mid thoracic paraspinal level. 5. There is no evidence of active or chronic motor axonal loss changes affecting the hyoglossus muscle.  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    ___________________________ Narda Amber, DO    Nerve Conduction Studies Anti Sensory Summary Table   Site NR Peak (ms) Norm Peak (ms) P-T Amp (V) Norm P-T Amp  Right Median Anti Sensory (2nd Digit)  34C  Wrist    3.2 <3.8 20.1 >10  Right Ulnar Anti Sensory (5th Digit)  34C  Wrist    2.7 <3.2 22.4 >5   Motor Summary Table   Site NR Onset (ms) Norm Onset (ms) O-P Amp (mV) Norm O-P Amp Site1 Site2 Delta-0 (ms) Dist (cm) Vel (m/s) Norm Vel (m/s)  Right Median Motor (Abd Poll  Brev)  34C  Wrist    3.4 <4.0 7.5 >5 Elbow Wrist 4.8 26.0 54 >50  Elbow    8.2  6.7         Right Ulnar Motor (Abd Dig Minimi)  34C  Wrist    2.3 <3.1 7.3 >7 B Elbow Wrist 3.7 24.5 66 >50  B Elbow    6.0  6.7  A Elbow B Elbow 1.5 10.0 67 >50  A Elbow    7.5  6.4          EMG   Side Muscle Ins Act Fibs Psw Fasc Number Recrt Dur Dur. Amp Amp. Poly Poly. Comment  Right 1stDorInt Nml Nml Nml Nml 1- Rapid Few 1+ Few 1+ Nml Nml N/A  Right Abd Poll Brev Nml Nml Nml Nml 2- Rapid Many 1+ Many 1+ Many 1+ N/A  Right ABD Dig Min Nml 1+ Nml Nml 1- Rapid Some 1+ Some 1+ Some 1+ N/A  Right Ext Indicis Nml Nml Nml Nml Nml Nml Nml Nml Nml Nml Nml Nml N/A  Right PronatorTeres Nml Nml Nml Nml Nml Nml Nml Nml Nml Nml Nml Nml N/A  Right Biceps Nml Nml Nml 1+ Nml Nml Nml Nml Nml Nml Nml Nml N/A  Right Cervical Parasp Low Nml Nml Nml Nml NE - - - - - - - N/A  Right Triceps Nml Nml Nml 1+ Nml Nml Nml Nml Nml Nml Nml Nml N/A  Right Deltoid Nml Nml Nml 1+ Nml Nml Nml Nml Nml Nml Nml Nml N/A  Right Thoracic Parasp Mid Nml 1+ Nml  Nml NE Rapid - - - - - - N/A  Left 1stDorInt Nml Nml Nml Nml 1- Rapid Few 1+ Few 1+ Nml Nml N/A  Left Abd Poll Brev Nml Nml Nml Nml 1- Rapid Few 1+ Few 1+ Nml Nml N/A  Left PronatorTeres Nml Nml Nml Nml Nml Nml Nml Nml Nml Nml Nml Nml N/A  Left Biceps Nml Nml Nml Nml Nml Nml Nml Nml Nml Nml Nml Nml N/A  Left Triceps Nml Nml Nml 1+ 1- Rapid Some 1+ Some 1+ Nml Nml N/A  Left Deltoid Nml Nml Nml 1+ Nml Nml Nml Nml Nml Nml Nml Nml N/A  Right Hyoglossus Nml Nml Nml Nml Nml Nml Nml Nml Nml Nml Nml Nml N/A  Left ABD Dig Min Nml 1+ Nml Nml 1- Rapid Some 1+ Some 1+ Nml Nml N/A      Waveforms:

## 2019-11-25 ENCOUNTER — Telehealth: Payer: Self-pay | Admitting: Neurology

## 2019-11-25 NOTE — Telephone Encounter (Signed)
Patient wants to check the status of the referral to the ALS clinic in [place]. She has not heard from anyone at this time.   She also wants to know if she should and can take the COVID shot   Please call

## 2019-11-26 ENCOUNTER — Other Ambulatory Visit: Payer: Self-pay

## 2019-11-26 DIAGNOSIS — G1221 Amyotrophic lateral sclerosis: Secondary | ICD-10-CM

## 2019-11-26 NOTE — Telephone Encounter (Signed)
Patient lmom regarding her needing a referral for the ALS clinic. Thank you

## 2019-11-26 NOTE — Telephone Encounter (Signed)
Patient advised to call ALS clinic, and okay for covid vaccine

## 2019-11-26 NOTE — Telephone Encounter (Signed)
Okay to get Covid vaccine 

## 2019-11-26 NOTE — Telephone Encounter (Signed)
Resent notes and refaxed to ALS clinic [phone] is a new fax number

## 2019-11-26 NOTE — Telephone Encounter (Signed)
Yes, it is okay to get the Covid vaccine.

## 2019-11-28 ENCOUNTER — Telehealth: Payer: Self-pay

## 2019-11-28 NOTE — Telephone Encounter (Signed)
Spoke with ALS clinic at [facility] at [phone]. Appt is pending at this time. Will let office know when appt is.

## 2019-12-18 ENCOUNTER — Telehealth: Payer: Self-pay | Admitting: Neurology

## 2019-12-18 ENCOUNTER — Other Ambulatory Visit: Payer: Self-pay

## 2019-12-18 DIAGNOSIS — R29898 Other symptoms and signs involving the musculoskeletal system: Secondary | ICD-10-CM

## 2019-12-18 DIAGNOSIS — G1220 Motor neuron disease, unspecified: Secondary | ICD-10-CM

## 2019-12-18 NOTE — Telephone Encounter (Signed)
Patient states that she would like to know if we can put in a referral for some in home health to help her with bathing and things like that

## 2019-12-18 NOTE — Progress Notes (Signed)
Home health order placed.

## 2019-12-18 NOTE — Telephone Encounter (Signed)
Orders placed for ref for home health for help with ADLs

## 2019-12-18 NOTE — Telephone Encounter (Signed)
Yes, absolutely.

## 2019-12-19 ENCOUNTER — Other Ambulatory Visit: Payer: Self-pay

## 2019-12-19 DIAGNOSIS — G1221 Amyotrophic lateral sclerosis: Secondary | ICD-10-CM

## 2019-12-19 DIAGNOSIS — R29898 Other symptoms and signs involving the musculoskeletal system: Secondary | ICD-10-CM

## 2019-12-19 DIAGNOSIS — M2170 Unequal limb length (acquired), unspecified site: Secondary | ICD-10-CM

## 2019-12-19 NOTE — Progress Notes (Signed)
Home health for pt and ot

## 2019-12-20 NOTE — Progress Notes (Signed)
We have accepted this referral. I'll get it entered for a SOC within the next 24-48 hours

## 2019-12-23 ENCOUNTER — Telehealth: Payer: Self-pay

## 2019-12-23 NOTE — Telephone Encounter (Signed)
Pt going to a clinic appt next week and wants to wait to schedule with [facility] until the following week depending on what happens at the clinic appt. Per call from [place] at [facility]. [phone].

## 2019-12-23 NOTE — Telephone Encounter (Signed)
[name] with [facility] called and left a message to let the office know that PT is postponing her PT until this Friday, 12/27/19. Any problems, please call back.

## 2020-02-05 DIAGNOSIS — Z0279 Encounter for issue of other medical certificate: Secondary | ICD-10-CM

## 2020-04-21 ENCOUNTER — Emergency Department (HOSPITAL_COMMUNITY)
Admission: EM | Admit: 2020-04-21 | Discharge: 2020-04-21 | Disposition: A | Discharge status: Home / Self Care | Payer: Medicare Other | Attending: Emergency Medicine | Admitting: Emergency Medicine

## 2020-04-21 ENCOUNTER — Encounter (HOSPITAL_COMMUNITY): Payer: Self-pay | Admitting: Emergency Medicine

## 2020-04-21 ENCOUNTER — Other Ambulatory Visit: Payer: Self-pay

## 2020-04-21 ENCOUNTER — Emergency Department (HOSPITAL_COMMUNITY): Payer: Medicare Other

## 2020-04-21 DIAGNOSIS — Y939 Activity, unspecified: Secondary | ICD-10-CM | POA: Diagnosis not present

## 2020-04-21 DIAGNOSIS — Y9289 Other specified places as the place of occurrence of the external cause: Secondary | ICD-10-CM | POA: Diagnosis not present

## 2020-04-21 DIAGNOSIS — W010XXA Fall on same level from slipping, tripping and stumbling without subsequent striking against object, initial encounter: Secondary | ICD-10-CM | POA: Insufficient documentation

## 2020-04-21 DIAGNOSIS — Y999 Unspecified external cause status: Secondary | ICD-10-CM | POA: Diagnosis not present

## 2020-04-21 DIAGNOSIS — F17210 Nicotine dependence, cigarettes, uncomplicated: Secondary | ICD-10-CM | POA: Diagnosis not present

## 2020-04-21 DIAGNOSIS — E039 Hypothyroidism, unspecified: Secondary | ICD-10-CM | POA: Diagnosis not present

## 2020-04-21 DIAGNOSIS — Z79899 Other long term (current) drug therapy: Secondary | ICD-10-CM | POA: Diagnosis not present

## 2020-04-21 DIAGNOSIS — W19XXXA Unspecified fall, initial encounter: Secondary | ICD-10-CM

## 2020-04-21 DIAGNOSIS — S92902A Unspecified fracture of left foot, initial encounter for closed fracture: Secondary | ICD-10-CM

## 2020-04-21 DIAGNOSIS — I10 Essential (primary) hypertension: Secondary | ICD-10-CM | POA: Insufficient documentation

## 2020-04-21 DIAGNOSIS — M79672 Pain in left foot: Secondary | ICD-10-CM | POA: Insufficient documentation

## 2020-04-21 MED ORDER — TRAMADOL HCL 50 MG PO TABS: 50.0000 mg | ORAL_TABLET | Freq: Four times a day (QID) | ORAL | 0 refills | Status: AC | PRN

## 2020-04-21 MED ORDER — TRAMADOL HCL 50 MG PO TABS: 50.0000 mg | ORAL_TABLET | Freq: Four times a day (QID) | ORAL | 0 refills | Status: DC | PRN

## 2020-04-21 MED ORDER — TRAMADOL HCL 50 MG PO TABS
50.0000 mg | ORAL_TABLET | Freq: Once | ORAL | Status: AC
  Administered 2020-04-21: 50 mg via ORAL
  Filled 2020-04-21: qty 1

## 2020-04-21 NOTE — ED Notes (Signed)
[facility] at bedside for transport at this time

## 2020-04-21 NOTE — ED Provider Notes (Signed)
[facility]-EMERGENCY DEPT Provider Note   CSN: [id number] Arrival date & time: 04/21/20  1515     History Chief Complaint  Patient presents with  . Foot Pain    Marie Dominguez is a 69 y.o. female history of hyperlipidemia, hypertension, here presenting with foot pain.  Patient states that she has ALS and is unable to walk.  She states that she stood up yesterday and tripped over her foot.  She states that she landed on both feet.  She states that both of her legs hurt and worse on the left.  She denies any other injuries.  She took some ibuprofen with minimal relief.  The history is provided by the patient.       Past Medical History:  Diagnosis Date  . Acid reflux   . Arthritis   . Hyperlipemia   . Hypertension   . Hypothyroidism   . Kidney stones   . Thyroid disease     Patient Active Problem List   Diagnosis Date Noted  . Carpal tunnel syndrome 01/13/2014  . 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 OTHER ACQUIRED DEFORMITY OF ANKLE AND FOOT OTHER 07/15/2009  . UNEQUAL LEG LENGTH 07/15/2009    Past Surgical History:  Procedure Laterality Date  . ABDOMINAL HYSTERECTOMY    . CYSTOSCOPY  08/15/2012   Procedure: CYSTOSCOPY;  Surgeon: Sebastian Ache, MD;  Location: [facility];  Service: Urology;  Laterality: N/A;  . FEMUR CLOSED REDUCTION    . NEPHROLITHOTOMY  08/15/2012   Procedure: NEPHROLITHOTOMY PERCUTANEOUS;  Surgeon: Sebastian Ache, MD;  Location: [facility];  Service: Urology;  Laterality: Left;  WITH SURGEON ACCESS   . NEPHROLITHOTOMY  08/17/2012   Procedure: NEPHROLITHOTOMY PERCUTANEOUS SECOND LOOK;  Surgeon: Sebastian Ache, MD;  Location: [facility];  Service: Urology;  Laterality: Left;  LEFT 2ND STAGE PCNL  . SHOULDER SURGERY       OB History   No obstetric history on file.     Family History  Problem Relation Age of Onset  . Cancer Other   . Breast cancer Maternal Aunt     Social History   Tobacco Use  . Smoking status: Current  Every Day Smoker    Packs/day: 0.50    Years: 34.00    Pack years: 17.00    Last attempt to quit: 08/13/2002    Years since quitting: 17.7  . Smokeless tobacco: Never Used  Vaping Use  . Vaping Use: Never used  Substance Use Topics  . Alcohol use: Yes    Alcohol/week: 1.0 standard drink    Types: 1 Glasses of wine per week    Comment: daily-wine  . Drug use: No    Home Medications Prior to Admission medications   Medication Sig Start Date End Date Taking? Authorizing Provider  citalopram (CELEXA) 40 MG tablet Take 40 mg by mouth every morning.    [provider]  fish oil-omega-3 fatty acids 1000 MG capsule Take 2 g by mouth 2 (two) times daily.    [provider]  glucosamine-chondroitin 500-400 MG tablet Take 2 tablets by mouth daily.     [provider]  levothyroxine (SYNTHROID, LEVOTHROID) 50 MCG tablet Take 50 mcg by mouth every morning.    [provider]  lisinopril (PRINIVIL,ZESTRIL) 10 MG tablet Take 10 mg by mouth daily.    [provider]  LYSINE PO Take 1 tablet by mouth daily.    [provider]  meloxicam (MOBIC) 15 MG tablet  Take 15 mg by mouth daily as needed for pain.    [provider]  Multiple Vitamins-Minerals (MULTIVITAMIN WITH MINERALS) tablet Take 1 tablet by mouth daily.    [provider]  Vitamin D, Ergocalciferol, (DRISDOL) 50000 UNITS CAPS Take 50,000 Units by mouth every Thursday.     [provider]    Allergies    Patient has no known allergies.  Review of Systems   Review of Systems  Musculoskeletal:       Foot pain   All other systems reviewed and are negative.   Physical Exam Updated Vital Signs BP (!) 136/76   Pulse 74   Temp 98.2 F (36.8 C)   Resp 17   SpO2 98%   Physical Exam Vitals and nursing note reviewed.  Constitutional:      Comments: Chronically ill   HENT:     Head: Normocephalic.     Mouth/Throat:     Mouth: Mucous membranes are  moist.  Eyes:     Extraocular Movements: Extraocular movements intact.     Pupils: Pupils are equal, round, and reactive to light.  Cardiovascular:     Rate and Rhythm: Normal rate.     Pulses: Normal pulses.  Pulmonary:     Effort: Pulmonary effort is normal.     Breath sounds: Normal breath sounds.  Abdominal:     General: Abdomen is flat.     Palpations: Abdomen is soft.  Musculoskeletal:     Cervical back: Normal range of motion.     Comments: Left foot swelling and some tenderness.  Right foot nontender or swollen.  No ankle or tib-fib tenderness.  Neurovascular intact in bilateral lower extremities.  No other extremity trauma  Skin:    General: Skin is warm.     Capillary Refill: Capillary refill takes less than 2 seconds.  Neurological:     General: No focal deficit present.     Mental Status: She is alert and oriented to person, place, and time.  Psychiatric:        Mood and Affect: Mood normal.        Behavior: Behavior normal.     ED Results / Procedures / Treatments   Labs (all labs ordered are listed, but only abnormal results are displayed) Labs Reviewed - No data to display  EKG None  Radiology DG Foot Complete Left  Result Date: 04/21/2020 CLINICAL DATA:  Fall with redness and swelling EXAM: LEFT FOOT - COMPLETE 3+ VIEW COMPARISON:  None. FINDINGS: Bones appear diffusely demineralized. Possible acute nondisplaced intra-articular fracture at the lateral base of the second proximal phalanx. No subluxation. Degenerative changes at the first MTP joint. Small plantar calcaneal spur. IMPRESSION: Possible acute nondisplaced intra-articular fracture at the lateral base of the second proximal phalanx. Electronically Signed   By: Jasmine Pang M.D.   On: 04/21/2020 16:08   DG Foot Complete Right  Result Date: 04/21/2020 CLINICAL DATA:  Fall with redness and swelling to both feet EXAM: RIGHT FOOT COMPLETE - 3+ VIEW COMPARISON:  None. FINDINGS: Bones appear diffusely  demineralized. No definitive displaced fracture or malalignment is seen. There are degenerative changes at the first MTP joint and TMT joint. IMPRESSION: No acute osseous abnormality. Electronically Signed   By: Jasmine Pang M.D.   On: 04/21/2020 16:06    Procedures Procedures (including critical care time)  Medications Ordered in ED Medications  traMADol (ULTRAM) tablet 50 mg (has no administration in time range)    ED Course  I have reviewed the triage vital signs and the nursing notes.  Pertinent labs & imaging results that were available during my care of the patient were reviewed by me and considered in my medical decision making (see chart for details).    MDM Rules/Calculators/A&P                          Cornella Emmer Hon is a 69 y.o. female here presenting with foot pain.  Patient is nonambulatory at baseline but did pivot and fell on her foot.  X-ray showed a possible nondisplaced fracture of the left second proximal phalanx.  Since patient is now ambulatory, I will hold off on giving her a Cam walker or postop shoe.  Will prescribe tramadol for pain and have her follow-up with Ortho.  Final Clinical Impression(s) / ED Diagnoses Final diagnoses:  Fall    Rx / DC Orders ED Discharge Orders    None       Charlynne Pander, MD 04/21/20 1654

## 2020-04-21 NOTE — ED Notes (Signed)
[facility] called for transport back to facility 

## 2020-04-21 NOTE — Discharge Instructions (Signed)
Take motrin and tramadol for pain   You have a small fracture in the toe of the left foot.  Please do not bear weight on it.  Get repeat x-ray with orthopedic doctor in a week.  Return to ER if you have worse foot pain and swelling, another fall.

## 2020-04-21 NOTE — ED Triage Notes (Signed)
Per EMS-patient has a history of ALS-fell yesterday-complaining or right foot pain-no deformity, no swelling

## 2020-04-26 ENCOUNTER — Emergency Department (HOSPITAL_COMMUNITY): Payer: Medicare Other

## 2020-04-26 ENCOUNTER — Emergency Department (HOSPITAL_COMMUNITY)
Admission: EM | Admit: 2020-04-26 | Discharge: 2020-04-26 | Disposition: A | Discharge status: Home / Self Care | Payer: Medicare Other | Attending: Emergency Medicine | Admitting: Emergency Medicine

## 2020-04-26 ENCOUNTER — Encounter (HOSPITAL_COMMUNITY): Payer: Self-pay

## 2020-04-26 ENCOUNTER — Other Ambulatory Visit: Payer: Self-pay

## 2020-04-26 DIAGNOSIS — G1221 Amyotrophic lateral sclerosis: Secondary | ICD-10-CM | POA: Insufficient documentation

## 2020-04-26 DIAGNOSIS — F17200 Nicotine dependence, unspecified, uncomplicated: Secondary | ICD-10-CM | POA: Diagnosis not present

## 2020-04-26 DIAGNOSIS — R339 Retention of urine, unspecified: Secondary | ICD-10-CM | POA: Diagnosis not present

## 2020-04-26 DIAGNOSIS — Z79899 Other long term (current) drug therapy: Secondary | ICD-10-CM | POA: Insufficient documentation

## 2020-04-26 DIAGNOSIS — I10 Essential (primary) hypertension: Secondary | ICD-10-CM | POA: Diagnosis not present

## 2020-04-26 DIAGNOSIS — K5900 Constipation, unspecified: Secondary | ICD-10-CM | POA: Diagnosis not present

## 2020-04-26 DIAGNOSIS — E039 Hypothyroidism, unspecified: Secondary | ICD-10-CM | POA: Diagnosis not present

## 2020-04-26 DIAGNOSIS — M549 Dorsalgia, unspecified: Secondary | ICD-10-CM | POA: Diagnosis not present

## 2020-04-26 DIAGNOSIS — Z79890 Hormone replacement therapy (postmenopausal): Secondary | ICD-10-CM | POA: Insufficient documentation

## 2020-04-26 DIAGNOSIS — R109 Unspecified abdominal pain: Secondary | ICD-10-CM | POA: Diagnosis present

## 2020-04-26 LAB — COMPREHENSIVE METABOLIC PANEL
ALT: 24 U/L (ref 0–44)
AST: 20 U/L (ref 15–41)
Albumin: 3.8 g/dL (ref 3.5–5.0)
Alkaline Phosphatase: 47 U/L (ref 38–126)
Anion gap: 11 (ref 5–15)
BUN: 24 mg/dL — ABNORMAL HIGH (ref 8–23)
CO2: 27 mmol/L (ref 22–32)
Calcium: 9.7 mg/dL (ref 8.9–10.3)
Chloride: 106 mmol/L (ref 98–111)
Creatinine, Ser: 0.53 mg/dL (ref 0.44–1.00)
GFR calc Af Amer: >60 mL/min (ref >60)
GFR calc non Af Amer: >60 mL/min (ref >60)
Glucose, Bld: 111 mg/dL — ABNORMAL HIGH (ref 70–99)
Potassium: 3.6 mmol/L (ref 3.5–5.1)
Sodium: 144 mmol/L (ref 135–145)
Total Bilirubin: 0.7 mg/dL (ref 0.3–1.2)
Total Protein: 7.3 g/dL (ref 6.5–8.1)

## 2020-04-26 LAB — CBC WITH DIFFERENTIAL/PLATELET
Abs Immature Granulocytes: 0.02 10*3/uL (ref 0.00–0.07)
Basophils Absolute: 0 10*3/uL (ref 0.0–0.1)
Basophils Relative: 0 %
Eosinophils Absolute: 0.1 10*3/uL (ref 0.0–0.5)
Eosinophils Relative: 1 %
HCT: 37.2 % (ref 36.0–46.0)
Hemoglobin: 12.7 g/dL (ref 12.0–15.0)
Immature Granulocytes: 0 %
Lymphocytes Relative: 15 %
Lymphs Abs: 1 10*3/uL (ref 0.7–4.0)
MCH: 31.4 pg (ref 26.0–34.0)
MCHC: 34.1 g/dL (ref 30.0–36.0)
MCV: 91.9 fL (ref 80.0–100.0)
Monocytes Absolute: 0.4 10*3/uL (ref 0.1–1.0)
Monocytes Relative: 6 %
Neutro Abs: 5.3 10*3/uL (ref 1.7–7.7)
Neutrophils Relative %: 78 %
Platelets: 253 10*3/uL (ref 150–400)
RBC: 4.05 MIL/uL (ref 3.87–5.11)
RDW: 11.9 % (ref 11.5–15.5)
WBC: 6.8 10*3/uL (ref 4.0–10.5)
nRBC: 0 % (ref 0.0–0.2)

## 2020-04-26 LAB — URINALYSIS, ROUTINE W REFLEX MICROSCOPIC
Bilirubin Urine: NEGATIVE
Glucose, UA: NEGATIVE mg/dL
Hgb urine dipstick: NEGATIVE
Ketones, ur: NEGATIVE mg/dL
Leukocytes,Ua: NEGATIVE
Nitrite: NEGATIVE
Protein, ur: NEGATIVE mg/dL
Specific Gravity, Urine: 1.013 (ref 1.005–1.030)
pH: 7 (ref 5.0–8.0)

## 2020-04-26 LAB — LIPASE, BLOOD: Lipase: 34 U/L (ref 11–51)

## 2020-04-26 MED ORDER — ONDANSETRON HCL 4 MG/2ML IJ SOLN
4.0000 mg | Freq: Once | INTRAMUSCULAR | Status: DC
  Filled 2020-04-26: qty 2

## 2020-04-26 MED ORDER — LACTULOSE 10 GM/15ML PO SOLN
20.0000 g | Freq: Once | ORAL | Status: AC
  Administered 2020-04-26: 20 g via ORAL
  Filled 2020-04-26: qty 30

## 2020-04-26 MED ORDER — IOHEXOL 300 MG/ML  SOLN
100.0000 mL | Freq: Once | INTRAMUSCULAR | Status: AC | PRN
  Administered 2020-04-26: 100 mL via INTRAVENOUS

## 2020-04-26 MED ORDER — SORBITOL 70 % SOLN
960.0000 mL | TOPICAL_OIL | Freq: Once | ORAL | Status: AC
  Administered 2020-04-26: 960 mL via RECTAL
  Filled 2020-04-26: qty 473

## 2020-04-26 MED ORDER — IBUPROFEN 800 MG PO TABS
800.0000 mg | ORAL_TABLET | Freq: Once | ORAL | Status: AC
  Administered 2020-04-26: 800 mg via ORAL
  Filled 2020-04-26: qty 1

## 2020-04-26 MED ORDER — LORAZEPAM 1 MG PO TABS
1.0000 mg | ORAL_TABLET | Freq: Once | ORAL | Status: AC
  Administered 2020-04-26: 1 mg via ORAL
  Filled 2020-04-26: qty 1

## 2020-04-26 MED ORDER — SODIUM CHLORIDE (PF) 0.9 % IJ SOLN
INTRAMUSCULAR | Status: AC
  Filled 2020-04-26: qty 50

## 2020-04-26 MED ORDER — SODIUM CHLORIDE 0.9 % IV BOLUS
500.0000 mL | Freq: Once | INTRAVENOUS | Status: AC
  Administered 2020-04-26: 500 mL via INTRAVENOUS

## 2020-04-26 MED ORDER — MORPHINE SULFATE (PF) 4 MG/ML IV SOLN
4.0000 mg | Freq: Once | INTRAVENOUS | Status: DC
  Filled 2020-04-26: qty 1

## 2020-04-26 MED ORDER — SODIUM CHLORIDE 0.9 % IV BOLUS
1000.0000 mL | Freq: Once | INTRAVENOUS | Status: AC
  Administered 2020-04-26: 1000 mL via INTRAVENOUS

## 2020-04-26 MED ORDER — LORAZEPAM 2 MG/ML IJ SOLN
1.0000 mg | Freq: Once | INTRAMUSCULAR | Status: AC
  Administered 2020-04-26: 1 mg via INTRAVENOUS
  Filled 2020-04-26: qty 1

## 2020-04-26 NOTE — Discharge Instructions (Signed)
Please call Dr. Jacalyn Lefevre tomorrow to schedule an appointment for further evaluation given new urinary retention/inability to void. Keep catheter in until you can be evaluated by him and his team.   I would recommend continuing Miralax at home for the constipation and increasing your water intake at home to stay hydrated.   Return to the ED IMMEDIATELY for any worsening symptoms

## 2020-04-26 NOTE — ED Provider Notes (Addendum)
[facility]-EMERGENCY DEPT Provider Note   CSN: [id number] Arrival date & time: 04/26/20  1040    History Chief Complaint  Patient presents with  . Abdominal Pain    Marie Dominguez is a 69 y.o. female with past medical history significant for nephrolithiasis, hypothyroidism, hypertension, hyperlipidemia, reflux who presents for evaluation of dysuria, urinary frequency and lower abdominal pain.  Patient also notes she has been constipated x1 week.  Patient states she has not voided since yesterday evening.  Prior to that she did not have some dysuria, frequency.  States she is also been constipated x1 week.  Normally has bowel movements every 3 days.  Unfortunately patient has ALS and is nonambulatory at baseline.  She did suffer a mechanical fall 3 days ago and was seen here in the emergency department. Dx with foot fx. Denies hitting her head, LOC.  Patient states she stood up at that time and tripped over her foot.    Had some bilateral leg pain however has some degree of pain at baseline.  NO new onset weakness or paresthesias. No melena or BRBPR with prior bowel movements.  Patient reports a good appetite and oral intake at baseline.  Does have decreased fluid intake at baseline because she does not like to transfer to void her bladder.  She lives with caretakers.  Has tried Miralax, fleets enema without relief. Can intermittently bear weight to pivot however is nonambulatory.  Denies fever, chills, nausea, vomiting, chest pain, shortness of breath, new weakness or paresthesias.  No back pain.  Denies aggravating or relieving factors.  History obtained from patient, caretaker and past medical records. No interpretor was used.  GI- None ALS Team - [facility]  HPI     Past Medical History:  Diagnosis Date  . Acid reflux   . Arthritis   . Hyperlipemia   . Hypertension   . Hypothyroidism   . Kidney stones   . Thyroid disease     Patient Active Problem List    Diagnosis Date Noted  . Carpal tunnel syndrome 01/13/2014  . 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 OTHER ACQUIRED DEFORMITY OF ANKLE AND FOOT OTHER 07/15/2009  . UNEQUAL LEG LENGTH 07/15/2009    Past Surgical History:  Procedure Laterality Date  . ABDOMINAL HYSTERECTOMY    . CYSTOSCOPY  08/15/2012   Procedure: CYSTOSCOPY;  Surgeon: Sebastian Ache, MD;  Location: [facility];  Service: Urology;  Laterality: N/A;  . FEMUR CLOSED REDUCTION    . NEPHROLITHOTOMY  08/15/2012   Procedure: NEPHROLITHOTOMY PERCUTANEOUS;  Surgeon: Sebastian Ache, MD;  Location: [facility];  Service: Urology;  Laterality: Left;  WITH SURGEON ACCESS   . NEPHROLITHOTOMY  08/17/2012   Procedure: NEPHROLITHOTOMY PERCUTANEOUS SECOND LOOK;  Surgeon: Sebastian Ache, MD;  Location: [facility];  Service: Urology;  Laterality: Left;  LEFT 2ND STAGE PCNL  . SHOULDER SURGERY       OB History   No obstetric history on file.     Family History  Problem Relation Age of Onset  . Cancer Other   . Breast cancer Maternal Aunt     Social History   Tobacco Use  . Smoking status: Current Every Day Smoker    Packs/day: 0.50    Years: 34.00    Pack years: 17.00    Last attempt to quit: 08/13/2002    Years since quitting: 17.7  . Smokeless tobacco: Never Used  Vaping Use  . Vaping Use: Never used  Substance Use  Topics  . Alcohol use: Yes    Alcohol/week: 1.0 standard drink    Types: 1 Glasses of wine per week    Comment: daily-wine  . Drug use: No    Home Medications Prior to Admission medications   Medication Sig Start Date End Date Taking? Authorizing Provider  citalopram (CELEXA) 40 MG tablet Take 40 mg by mouth every morning.    [provider]  fish oil-omega-3 fatty acids 1000 MG capsule Take 2 g by mouth 2 (two) times daily.    [provider]  glucosamine-chondroitin 500-400 MG tablet Take 2 tablets by mouth daily.     [provider]  levothyroxine (SYNTHROID, LEVOTHROID) 50 MCG  tablet Take 50 mcg by mouth every morning.    [provider]  lisinopril (PRINIVIL,ZESTRIL) 10 MG tablet Take 10 mg by mouth daily.    [provider]  LYSINE PO Take 1 tablet by mouth daily.    [provider]  meloxicam (MOBIC) 15 MG tablet Take 15 mg by mouth daily as needed for pain.    [provider]  Multiple Vitamins-Minerals (MULTIVITAMIN WITH MINERALS) tablet Take 1 tablet by mouth daily.    [provider]  traMADol (ULTRAM) 50 MG tablet Take 1 tablet (50 mg total) by mouth every 6 (six) hours as needed. 04/21/20   Charlynne Pander, MD  Vitamin D, Ergocalciferol, (DRISDOL) 50000 UNITS CAPS Take 50,000 Units by mouth every Thursday.     [provider]    Allergies    Gabapentin  Review of Systems   Review of Systems  Constitutional: Negative.   HENT: Negative.   Respiratory: Negative.   Cardiovascular: Negative.   Gastrointestinal: Positive for abdominal pain and constipation. Negative for abdominal distention, anal bleeding, blood in stool, diarrhea, nausea, rectal pain and vomiting.  Genitourinary: Positive for difficulty urinating, dysuria, frequency and urgency. Negative for decreased urine volume, dyspareunia, enuresis, flank pain, genital sores, hematuria, menstrual problem, pelvic pain, vaginal bleeding, vaginal discharge and vaginal pain.  Musculoskeletal: Negative.   Skin: Negative.   Neurological: Negative.   All other systems reviewed and are negative.   Physical Exam Updated Vital Signs BP (!) 153/82 (BP Location: Right Arm)   Pulse 71   Temp 98.4 F (36.9 C) (Oral)   Resp 22   Ht 5\' 7"  (1.702 m)   Wt 66.7 kg   SpO2 99%   BMI 23.02 kg/m   Physical Exam Vitals and nursing note reviewed. Exam conducted with a chaperone present.  Constitutional:      General: She is not in acute distress.    Appearance: She is well-developed. She is ill-appearing (Chronically ill appearing). She is not  toxic-appearing.     Comments: Crying  HENT:     Head: Normocephalic and atraumatic.     Mouth/Throat:     Mouth: Mucous membranes are moist.  Eyes:     Pupils: Pupils are equal, round, and reactive to light.  Cardiovascular:     Rate and Rhythm: Normal rate.     Heart sounds: Normal heart sounds.  Pulmonary:     Effort: Pulmonary effort is normal. No respiratory distress.     Breath sounds: Normal breath sounds.  Abdominal:     General: Bowel sounds are normal. There is no distension.     Palpations: Abdomen is soft.     Tenderness: There is abdominal tenderness in the suprapubic area.     Hernia: No hernia is present.  Comments: Suprapubic fullness  Genitourinary:    Rectum: Normal. No mass, tenderness, anal fissure, external hemorrhoid or internal hemorrhoid. Normal anal tone.     Comments: Severe rectal impaction, manual removal of 100cc light brown stool. Good rectal tone. No perineal numbness. Musculoskeletal:        General: Normal range of motion.     Cervical back: Normal range of motion.     Comments: Moves all 4 extremities without difficulty. No midline spinal tenderness. Compartments soft.  Skin:    General: Skin is warm and dry.     Capillary Refill: Capillary refill takes less than 2 seconds.     Comments: No erythema, warmth. Dependent lower extremity edema. No sacral wounds  Neurological:     General: No focal deficit present.     Mental Status: She is alert and oriented to person, place, and time.     Cranial Nerves: Cranial nerves are intact.     Sensory: Sensation is intact.     Motor: Weakness (Baseline) and tremor (Baseline) present.     Gait: Gait abnormal (Unable to ambulate at baseline).     Comments: Moves all 4 extremities Unable to ambulate at baseline Significant tremor to BUE L>R Intact sensation 4/5 strength to BL upper extremities 3/5-4/5 lower extremities  Neuro exam consistent with Neuro [facility] from [facility] 03/2119    ED Results / Procedures  / Treatments   Labs (all labs ordered are listed, but only abnormal results are displayed) Labs Reviewed  COMPREHENSIVE METABOLIC PANEL - Abnormal; Notable for the following components:      Result Value   Glucose, Bld 111 (*)    BUN 24 (*)    All other components within normal limits  URINE CULTURE  CBC WITH DIFFERENTIAL/PLATELET  LIPASE, BLOOD  URINALYSIS, ROUTINE W REFLEX MICROSCOPIC    EKG None  Radiology CT Abdomen Pelvis W Contrast  Result Date: 04/26/2020 CLINICAL DATA:  Lower abdominal pain.  Dysuria. EXAM: CT ABDOMEN AND PELVIS WITH CONTRAST TECHNIQUE: Multidetector CT imaging of the abdomen and pelvis was performed using the standard protocol following bolus administration of intravenous contrast. CONTRAST:  OMNIPAQUE IOHEXOL 300 MG/ML  SOLN COMPARISON:  08/28/2013 FINDINGS: Lower chest: Unremarkable. Hepatobiliary: 16 mm subcapsular cyst identified lateral segment left liver. No suspicious focal abnormality within the liver parenchyma. There is no evidence for gallstones, gallbladder wall thickening, or pericholecystic fluid. No intrahepatic or extrahepatic biliary dilation. Pancreas: No focal mass lesion. No dilatation of the main duct. No intraparenchymal cyst. No peripancreatic edema. Spleen: No splenomegaly. No focal mass lesion. Adrenals/Urinary Tract: No adrenal nodule or mass. Right kidney unremarkable. 9 mm hypoattenuating lesion in the lower pole left kidney is too small to characterize but likely a cyst. No evidence for hydroureter. The urinary bladder appears normal for the degree of distention. Stomach/Bowel: Stomach is unremarkable. No gastric wall thickening. No evidence of outlet obstruction. Duodenum is normally positioned as is the ligament of Treitz. No small bowel wall thickening. No small bowel dilatation. The terminal ileum is normal. No gross colonic mass. No colonic wall thickening. Diverticular changes are noted in the left colon without evidence of  diverticulitis. Prominent stool volume in the rectum. Vascular/Lymphatic: There is abdominal aortic atherosclerosis without aneurysm. There is no gastrohepatic or hepatoduodenal ligament lymphadenopathy. No retroperitoneal or mesenteric lymphadenopathy. No pelvic sidewall lymphadenopathy. Reproductive: The uterus is surgically absent. There is no adnexal mass. Other: No intraperitoneal free fluid. Musculoskeletal: No worrisome lytic or sclerotic osseous abnormality. IMPRESSION: 1. No acute  findings in the abdomen or pelvis. Specifically, no findings to explain the patient's history of lower abdominal pain. 2. Left colonic diverticulosis without diverticulitis and prominent rectal stool volume. 3. Aortic Atherosclerosis (ICD10-I70.0). Electronically Signed   By: Kennith CenterEric  Mansell M.D.   On: 04/26/2020 14:00   CT L-SPINE NO CHARGE  Result Date: 04/26/2020 CLINICAL DATA:  Back pain. Additional provided: Patient arrives from home with lower abdominal pain, painful urination since this morning, frequency and urgency, constipation for 1 week low back pain EXAM: CT LUMBAR SPINE WITHOUT CONTRAST TECHNIQUE: Multidetector CT imaging of the lumbar spine was performed without intravenous contrast administration. Multiplanar CT image reconstructions were also generated. COMPARISON:  Lumbar spine MRI 10/17/2019, CT abdomen/pelvis 08/31/2013 FINDINGS: Segmentation: For the purposes of this dictation, five lumbar vertebrae are assumed and the caudal most well-formed intervertebral disc is designated L5-S1. Alignment: Lumbar levocurvature. Mild L2-L3, L3-L4 and L4-L5 grade 1 retrolisthesis Vertebrae: Vertebral body height is maintained. No evidence of acute fracture to the lumbar spine. Multilevel degenerative endplate irregularity. Small L3 superior endplate Schmorl node. Multilevel ventrolateral osteophytes. A subcentimeter lucent focus within the right iliac bone was present on the prior examination of 08/31/2013 and likely  benign. Paraspinal and other soft tissues: Please refer to concurrently performed CT abdomen/pelvis for description of soft tissue findings. Atrophy of the lumbar paraspinal musculature. Disc levels: Moderate disc space narrowing at the T12-L1 through L4-L5 levels. Mild L5-S1 disc space narrowing. T12-L1: Disc bulge with endplate spurring. No appreciable significant spinal canal stenosis or neural foraminal narrowing. L1-L2: Disc bulge. Mild relative spinal canal narrowing. No significant foraminal stenosis L2-L3: Disc bulge. Superimposed broad-based left subarticular/foraminal disc protrusion. Mild facet arthrosis. Left greater than right subarticular stenosis. Mild central canal stenosis. Moderate left neural foraminal narrowing. L3-L4: Disc bulge with endplate spurring. Facet arthrosis/ligamentum flavum hypertrophy. Bilateral subarticular stenosis. No more than mild central canal narrowing. Mild bilateral neural foraminal narrowing. L4-L5: Disc bulge with endplate spurring. Facet arthrosis/ligamentum flavum hypertrophy. Bilateral subarticular stenosis with mild/mild central canal narrowing. Bilateral neural foraminal narrowing (moderate right, mild left). L5-S1: Disc bulge with endplate spurring. Facet arthrosis which is advanced on the left. Ligamentum flavum hypertrophy. Left greater than right subarticular stenosis. No more than mild central canal narrowing. Bilateral neural foraminal narrowing (moderate right, moderate/severe left). IMPRESSION: No evidence of acute fracture to the lumbar spine. Lumbar spondylosis as outlined. At L4-L5, there is multifactorial bilateral subarticular stenosis with mild/moderate central canal narrowing. Bilateral neural foraminal narrowing (moderate right, mild left). No more than mild appreciable central canal stenosis at the remaining levels. Additional sites of subarticular stenosis as detailed. Additional sites of neural foraminal narrowing as described and greatest  bilaterally at L5-S1 (moderate right, moderate/severe left). Electronically Signed   By: Jackey LogeKyle  Golden DO   On: 04/26/2020 13:32    Procedures Procedures (including critical care time)  Medications Ordered in ED Medications  ondansetron [facility](ZOFRAN) injection 4 mg (0 mg Intravenous Hold 04/26/20 1236)  morphine 4 MG/ML injection 4 mg (0 mg Intravenous Hold 04/26/20 1355)  sodium chloride (PF) 0.9 % injection (has no administration in time range)  sodium chloride 0.9 % bolus 500 mL (0 mLs Intravenous Stopped 04/26/20 1806)  iohexol (OMNIPAQUE) 300 MG/ML solution 100 mL (100 mLs Intravenous Contrast Given 04/26/20 1259)  ibuprofen (ADVIL) tablet 800 mg (800 mg Oral Given 04/26/20 1519)  lactulose (CHRONULAC) 10 GM/15ML solution 20 g (20 g Oral Given 04/26/20 1802)  sodium chloride 0.9 % bolus 1,000 mL (0 mLs Intravenous Stopped 04/26/20 1806)  sorbitol, milk of mag, mineral oil, glycerin (SMOG) enema (960 mLs Rectal Given 04/26/20 1829)  LORazepam (ATIVAN) injection 1 mg (1 mg Intravenous Given 04/26/20 1801)    ED Course  I have reviewed the triage vital signs and the nursing notes.  Pertinent labs & imaging results that were available during my care of the patient were reviewed by me and considered in my medical decision making (see chart for details).  57 old presents for evaluation of dysuria as well as constipation.  Symptoms x1 week.  Did have a fall 3 days ago. Afebrile, non septic, chronically ill appearing. States her strength and sensation is at baseline.  She is unable to ambulate at baseline.  Was typically able to pivot prior to fall however has foot fracture and is unable to pivot.  Has been having difficulty with bowel movements over the last week.  Before this was having bowel movements every 3 to 4 days which was similar to prior to her ALS diagnosis.  No new trauma or injury since her last evaluation here in the ED.  She has no midline back pain.  No saddle paresthesias, incontinence. Good rectal  tone. Patient with distended suprapubic abdomen and has not been able to urinate since yesterday evening.  Has had some dysuria, urgency onset this morning.  Nursing was unable to obtain bladder scan due to bladder scan machine being broken.  We did do in and out Foley catheter in which patient states her symptoms have significantly improved with 400cc clear UA.  She does feel very constipated. Plan on labs imaging and reassess. Neuro exam seems consistent with [facility] exam last week.  Labs and imaging personally reviewed and  interpreted:  CBC without leukocytosis, hemoglobin 12.7 Metabolic panel with mild hyperglycemia to 111, BUN 24, noticed electrolyte, renal abnormality Lipase 34 UA negative for infection, will send for culture CT AP with moderate stool  CT lumbar spine mild to moderate stenosis however no fractures, total canal occlusions. Per attending Dr. Pilar Plate hold on MR lumber to r/o cauda equina for now until we see if constipation induced urinary retention.  I was able to manually disimpact patient with large amount of light brown stool.  We will attempt at enema, see if patient can void on her own.  Patient with very small liquid stool here in ED. Tolerating PO intake without difficulty.  Patient not able to void. Does not feel the urge. Will give additional IVF.  Patient reassessed.  Has still not been able to void.  Feels the urge to go however is unable to go.  Repeat bladder scan with 408cc.  Is also not had a significant bowel movement.  I did repeat rectal exam which does have stool on distal end of fingertip however no additional stool able to be reached on DRE.  I discussed multiple options with patient. Question Constipation induced urinary retention, cauda equina, UTI, Neurodegenerative disorder for urine retention. No nephrolithiasis on CT imaging. I also discussed patient with attending, Dr. Adela Lank.  He recommends obtaining MRI to rule out cauda equina given persistent  urinary retention, and repeat catheter for retention as well as additional enema and reassess. Recommends if patient has BM with additional enema and is able to urinate on own and MR not yet completed then MRI can be cancelled.  I discussed this plan for MRI, increased bowel regimen, and a Foley catheter with patient and caregiver in room.  They are agreeable to this.  Patient seen and evaluated  by attending, Dr. Pilar Plate who agrees above treatment, plan and disposition.  Also discussed with attending, Dr. Adela Lank who agrees with plan.  Care transferred to [facility], [place] who will follow up on imaging, reevaluation and determine disposition.   MDM Rules/Calculators/A&P                           Final Clinical Impression(s) / ED Diagnoses Final diagnoses:  Back pain  Constipation, unspecified constipation type  Urinary retention  ALS (amyotrophic lateral sclerosis) (HCC)    Rx / DC Orders ED Discharge Orders    None       Detrice Cales A, PA-C 04/26/20 1714        Binyamin Nelis A, PA-C 04/26/20 1950    Sabas Sous, MD 04/27/20 [phone]

## 2020-04-26 NOTE — ED Notes (Signed)
Pt still unable to urinate on her on, requesting in/out cath, provider made aware.

## 2020-04-26 NOTE — ED Provider Notes (Signed)
Care [facility] from [facility], PA-C, at shift change, please see their notes for full documentation of patient's complaint/HPI. Briefly, pt here with complaint of abdominal pain, dysuria,urinary frequency, and constipation x 1 week however reports that she began having urinary retention yesterday. Results so far show negative CT A/P including L spine (with recent fall last week), no urinary infection, and normal labwork. Pt had 2 fecal disimpaction and 1 round of soap suds enema without much stool. Pt unable to void on her own in the ED and has had to be in and out cath'd once. Plan is to get MRI L spine to ensure there is no concern for cauda equina (pt with history of ALS and unable to assess whether LE function is decreased) as well as additional enema and repeat in and out cath. Follow up on MRI (if pt able to void on her own prior to MRI this can be canceled).   Physical Exam  BP (!) 139/79   Pulse 75   Temp 98.4 F (36.9 C) (Oral)   Resp 22   Ht 5\' 7"  (1.702 m)   Wt 66.7 kg   SpO2 95%   BMI 23.02 kg/m   Physical Exam  ED Course/Procedures   Clinical Course as of Apr 27 2055  Sun Apr 26, 2020  1218 Calcium: 9.7 [BH]    Clinical Course User Index [BH] Henderly, Britni A, PA-C    Procedures  Results for orders placed or performed during the hospital encounter of 04/26/20  CBC with Differential  Result Value Ref Range   WBC 6.8 4.0 - 10.5 K/uL   RBC 4.05 3.87 - 5.11 MIL/uL   Hemoglobin 12.7 12.0 - 15.0 g/dL   HCT 09.837.2 36 - 46 %   MCV 91.9 80.0 - 100.0 fL   MCH 31.4 26.0 - 34.0 pg   MCHC 34.1 30.0 - 36.0 g/dL   RDW 11.911.9 14.711.5 - 82.915.5 %   Platelets 253 150 - 400 K/uL   nRBC 0.0 0.0 - 0.2 %   Neutrophils Relative % 78 %   Neutro Abs 5.3 1.7 - 7.7 K/uL   Lymphocytes Relative 15 %   Lymphs Abs 1.0 0.7 - 4.0 K/uL   Monocytes Relative 6 %   Monocytes Absolute 0.4 0 - 1 K/uL   Eosinophils Relative 1 %   Eosinophils Absolute 0.1 0 - 0 K/uL   Basophils Relative 0 %    Basophils Absolute 0.0 0 - 0 K/uL   Immature Granulocytes 0 %   Abs Immature Granulocytes 0.02 0.00 - 0.07 K/uL  Comprehensive metabolic panel  Result Value Ref Range   Sodium 144 135 - 145 mmol/L   Potassium 3.6 3.5 - 5.1 mmol/L   Chloride 106 98 - 111 mmol/L   CO2 27 22 - 32 mmol/L   Glucose, Bld 111 (H) 70 - 99 mg/dL   BUN 24 (H) 8 - 23 mg/dL   Creatinine, Ser 5.620.53 0.44 - 1.00 mg/dL   Calcium 9.7 8.9 - 13.010.3 mg/dL   Total Protein 7.3 6.5 - 8.1 g/dL   Albumin 3.8 3.5 - 5.0 g/dL   AST 20 15 - 41 U/L   ALT 24 0 - 44 U/L   Alkaline Phosphatase 47 38 - 126 U/L   Total Bilirubin 0.7 0.3 - 1.2 mg/dL   GFR calc non Af Amer >60 >60 mL/min   GFR calc Af Amer >60 >60 mL/min   Anion gap 11 5 - 15  Lipase, blood  Result Value Ref Range   Lipase 34 11 - 51 U/L  Urinalysis, Routine w reflex microscopic  Result Value Ref Range   Color, Urine YELLOW YELLOW   APPearance CLEAR CLEAR   Specific Gravity, Urine 1.013 1.005 - 1.030   pH 7.0 5.0 - 8.0   Glucose, UA NEGATIVE NEGATIVE mg/dL   Hgb urine dipstick NEGATIVE NEGATIVE   Bilirubin Urine NEGATIVE NEGATIVE   Ketones, ur NEGATIVE NEGATIVE mg/dL   Protein, ur NEGATIVE NEGATIVE mg/dL   Nitrite NEGATIVE NEGATIVE   Leukocytes,Ua NEGATIVE NEGATIVE   MR LUMBAR SPINE WO CONTRAST  Result Date: 04/26/2020 CLINICAL DATA:  Motor neuron disease; new onset urinary and bowel retention. EXAM: MRI LUMBAR SPINE WITHOUT CONTRAST TECHNIQUE: Multiplanar, multisequence MR imaging of the lumbar spine was performed. No intravenous contrast was administered. COMPARISON:  CT of the lumbar spine performed earlier the same day 04/26/2020, MRI of the lumbar spine 10/17/2019 FINDINGS: Segmentation: 5 lumbar vertebrae. Alignment:  Mild L2-L3, L3-L4 and L4-L5 grade 1 retrolisthesis. Vertebrae: Vertebral body height is maintained. Focal fat versus small hemangiomas within the L2 and L5 vertebral bodies. No focal suspicious marrow lesion. No significant marrow edema.  Redemonstrated L3 superior endplate Schmorl node Conus medullaris and cauda equina: Conus extends to the T12-L1 level. No signal abnormality within the visualized distal spinal cord. Paraspinal and other soft tissues: Left renal cyst. Atrophy of the lumbar paraspinal musculature. Disc levels: Unless otherwise stated, the level by level findings below have not significantly changed since prior MRI 10/17/2019. Mild L5-S1 disc degeneration. There is otherwise mild/moderate disc degeneration throughout the lumbar spine. T12-L1: This level is imaged on the sagittal sequence only. Small central disc protrusion. No significant spinal canal stenosis or neural foraminal narrowing. L1-L2: Minimal disc bulge. No significant spinal canal or foraminal stenosis. L2-L3: Grade 1 retrolisthesis disc bulge. Superimposed broad-based left subarticular/foraminal disc protrusion. Mild facet arthrosis/ligamentum flavum hypertrophy. As before, the disc protrusion contributes to left subarticular stenosis, encroaching upon the descending left L3 nerve root (series 12, image 10). Central canal patent. Mild left foraminal narrowing. L3-L4: Grade 1 retrolisthesis. Disc bulge. Mild facet arthrosis/ligamentum flavum hypertrophy. Mild right subarticular narrowing. Central canal patent. Mild right neural foraminal narrowing. L4-L5: Grade 1 retrolisthesis. Disc bulge. Disc bulge. Mild facet arthrosis/ligamentum flavum hypertrophy. Mild/moderate right subarticular stenosis with crowding of the descending right L5 nerve root. Central canal patent. No significant foraminal stenosis. L5-S1: Disc bulge. Superimposed shallow central disc protrusion. Moderate facet arthrosis (greater on the left) with minimal ligamentum flavum hypertrophy. Mild left greater subarticular stenosis with crowding of the descending left S1 nerve root. Central canal patent. Moderate left foraminal stenosis. IMPRESSION: Lumbar spondylosis as outlined and not significantly  changed as compared to the MRI of 10/17/2019. Findings are most notably as follows. At L2-L3, a broad-based left subarticular/foraminal disc protrusion contributes to left subarticular stenosis, encroaching upon the descending left L3 nerve root. It also contributes to mild left neural foraminal narrowing. At L3-L4, multifactorial mild right subarticular stenosis and mild right neural foraminal narrowing. At L4-L5, multifactorial mild/moderate right subarticular stenosis with crowding of the descending right L5 nerve root. At L5-S1, a central disc protrusion contributes to multifactorial mild left subarticular narrowing with crowding of the descending left S1 nerve root. Moderate left neural foraminal narrowing. Mild/moderate disc degeneration at the T12-L1 through L4-L5 levels. Mild L2-L3, L3-L4 and L4-L5 grade 1 retrolisthesis. Electronically Signed   By: Jackey Loge DO   On: 04/26/2020 20:42   CT Abdomen Pelvis W Contrast  Result  Date: 04/26/2020 CLINICAL DATA:  Lower abdominal pain.  Dysuria. EXAM: CT ABDOMEN AND PELVIS WITH CONTRAST TECHNIQUE: Multidetector CT imaging of the abdomen and pelvis was performed using the standard protocol following bolus administration of intravenous contrast. CONTRAST:  OMNIPAQUE IOHEXOL 300 MG/ML  SOLN COMPARISON:  08/28/2013 FINDINGS: Lower chest: Unremarkable. Hepatobiliary: 16 mm subcapsular cyst identified lateral segment left liver. No suspicious focal abnormality within the liver parenchyma. There is no evidence for gallstones, gallbladder wall thickening, or pericholecystic fluid. No intrahepatic or extrahepatic biliary dilation. Pancreas: No focal mass lesion. No dilatation of the main duct. No intraparenchymal cyst. No peripancreatic edema. Spleen: No splenomegaly. No focal mass lesion. Adrenals/Urinary Tract: No adrenal nodule or mass. Right kidney unremarkable. 9 mm hypoattenuating lesion in the lower pole left kidney is too small to characterize but likely a  cyst. No evidence for hydroureter. The urinary bladder appears normal for the degree of distention. Stomach/Bowel: Stomach is unremarkable. No gastric wall thickening. No evidence of outlet obstruction. Duodenum is normally positioned as is the ligament of Treitz. No small bowel wall thickening. No small bowel dilatation. The terminal ileum is normal. No gross colonic mass. No colonic wall thickening. Diverticular changes are noted in the left colon without evidence of diverticulitis. Prominent stool volume in the rectum. Vascular/Lymphatic: There is abdominal aortic atherosclerosis without aneurysm. There is no gastrohepatic or hepatoduodenal ligament lymphadenopathy. No retroperitoneal or mesenteric lymphadenopathy. No pelvic sidewall lymphadenopathy. Reproductive: The uterus is surgically absent. There is no adnexal mass. Other: No intraperitoneal free fluid. Musculoskeletal: No worrisome lytic or sclerotic osseous abnormality. IMPRESSION: 1. No acute findings in the abdomen or pelvis. Specifically, no findings to explain the patient's history of lower abdominal pain. 2. Left colonic diverticulosis without diverticulitis and prominent rectal stool volume. 3. Aortic Atherosclerosis (ICD10-I70.0). Electronically Signed   By: Kennith Center M.D.   On: 04/26/2020 14:00   CT L-SPINE NO CHARGE  Result Date: 04/26/2020 CLINICAL DATA:  Back pain. Additional provided: Patient arrives from home with lower abdominal pain, painful urination since this morning, frequency and urgency, constipation for 1 week low back pain EXAM: CT LUMBAR SPINE WITHOUT CONTRAST TECHNIQUE: Multidetector CT imaging of the lumbar spine was performed without intravenous contrast administration. Multiplanar CT image reconstructions were also generated. COMPARISON:  Lumbar spine MRI 10/17/2019, CT abdomen/pelvis 08/31/2013 FINDINGS: Segmentation: For the purposes of this dictation, five lumbar vertebrae are assumed and the caudal most well-formed  intervertebral disc is designated L5-S1. Alignment: Lumbar levocurvature. Mild L2-L3, L3-L4 and L4-L5 grade 1 retrolisthesis Vertebrae: Vertebral body height is maintained. No evidence of acute fracture to the lumbar spine. Multilevel degenerative endplate irregularity. Small L3 superior endplate Schmorl node. Multilevel ventrolateral osteophytes. A subcentimeter lucent focus within the right iliac bone was present on the prior examination of 08/31/2013 and likely benign. Paraspinal and other soft tissues: Please refer to concurrently performed CT abdomen/pelvis for description of soft tissue findings. Atrophy of the lumbar paraspinal musculature. Disc levels: Moderate disc space narrowing at the T12-L1 through L4-L5 levels. Mild L5-S1 disc space narrowing. T12-L1: Disc bulge with endplate spurring. No appreciable significant spinal canal stenosis or neural foraminal narrowing. L1-L2: Disc bulge. Mild relative spinal canal narrowing. No significant foraminal stenosis L2-L3: Disc bulge. Superimposed broad-based left subarticular/foraminal disc protrusion. Mild facet arthrosis. Left greater than right subarticular stenosis. Mild central canal stenosis. Moderate left neural foraminal narrowing. L3-L4: Disc bulge with endplate spurring. Facet arthrosis/ligamentum flavum hypertrophy. Bilateral subarticular stenosis. No more than mild central canal narrowing. Mild bilateral neural foraminal  narrowing. L4-L5: Disc bulge with endplate spurring. Facet arthrosis/ligamentum flavum hypertrophy. Bilateral subarticular stenosis with mild/mild central canal narrowing. Bilateral neural foraminal narrowing (moderate right, mild left). L5-S1: Disc bulge with endplate spurring. Facet arthrosis which is advanced on the left. Ligamentum flavum hypertrophy. Left greater than right subarticular stenosis. No more than mild central canal narrowing. Bilateral neural foraminal narrowing (moderate right, moderate/severe left). IMPRESSION: No  evidence of acute fracture to the lumbar spine. Lumbar spondylosis as outlined. At L4-L5, there is multifactorial bilateral subarticular stenosis with mild/moderate central canal narrowing. Bilateral neural foraminal narrowing (moderate right, mild left). No more than mild appreciable central canal stenosis at the remaining levels. Additional sites of subarticular stenosis as detailed. Additional sites of neural foraminal narrowing as described and greatest bilaterally at L5-S1 (moderate right, moderate/severe left). Electronically Signed   By: Jackey Loge DO   On: 04/26/2020 13:32    MDM  MRI without findings of cauda equina; essentially no change from previous MRI done in January. On reevaluation it does appear she had a very small BM prior to going to MRI however continues to not be able to void. Will have pt placed on bed pan again to try to urinate however now she is stating she no longer has the urge to void.   Pt still unable to void. Repeat bladder scan with 215 mls. I discussed this with neurologist Dr. Wilford Corner given essentially negative work up; he believes this could be worsening progression of her ALS and recommends she sees her neurologist ASAP for further evaluation. He does not believe pt requires admission for this today. Will plan to discharge home with foley catheter in place with plan to see neurologist in the next 1-2 days. I have also recommended continuing miralax at home. Pt did have small BM by herself here after second enema. Pt and daughter updated on plan and in agreement. Pt stable to discharge. Will need PTAR for transfer.   This note was prepared using Dragon voice recognition software and may include unintentional dictation errors due to the inherent limitations of voice recognition software.     Tanda Rockers, PA-C 04/26/20 2237    Melene Plan, DO 04/26/20 2238

## 2020-04-26 NOTE — ED Notes (Signed)
Pt states she does not want morphine and would like something "less aggressive". Requesting advil. Provider aware.

## 2020-04-26 NOTE — ED Triage Notes (Signed)
Pt arrived via EMS from home for lower abd pain ,painful urination since this morning, frequency and urgency,  constipated x1 wk. Denies any n/v.

## 2020-04-26 NOTE — Social Work (Signed)
CSW met with Pt and caregiver, Carolyn Dingman at bedside to consult for home health/dme needs.  Pt is pleasant, A&Ox4.  Per Pt and caregiver, Hoyer lift and medical trapeze will be provided by [facility].  CSW conferred with TOC RN about home health. TOC team will follow up on home health 8/2.  CSW informed Pt and caregiver about plan. CSW confirmed caregiver telephone # [phone].  Sheilagh Harrington MSW LCSWA Transitions of Care  Clinical Social Worker  [facility]  [phone]  

## 2020-04-26 NOTE — ED Notes (Signed)
Patient taken to MRI

## 2020-04-27 ENCOUNTER — Telehealth: Payer: Self-pay | Admitting: Surgery

## 2020-04-27 LAB — URINE CULTURE: Culture: NO GROWTH

## 2020-04-27 NOTE — Progress Notes (Addendum)
04/27/2020 645 pm TOC CM received referral to assist with HH. Referral sent to [facility] for review. Waiting confirmation. Isidoro Donning RN CCM, [facility] ED TOC CM [phone]

## 2020-04-27 NOTE — Telephone Encounter (Signed)
CM forwarded [facility] referral to [facility], [facility], and [facility], patient has [facility] Medicare explained by CSW it may take some time, RNCM will follow up tomorrow for acceptance of referral by [facility] agencies.

## 2020-04-28 NOTE — Progress Notes (Signed)
04/28/2020 514 pm TOC CM contacted [facility] to review referral. Isidoro Donning RN CCM, [facility] ED TOC CM [phone]

## 2020-04-29 ENCOUNTER — Other Ambulatory Visit: Payer: Self-pay

## 2020-04-29 ENCOUNTER — Encounter (HOSPITAL_COMMUNITY): Payer: Self-pay | Admitting: Obstetrics and Gynecology

## 2020-04-29 ENCOUNTER — Emergency Department (HOSPITAL_COMMUNITY)
Admission: EM | Admit: 2020-04-29 | Discharge: 2020-04-29 | Disposition: A | Discharge status: Home / Self Care | Payer: Medicare Other | Attending: Emergency Medicine | Admitting: Emergency Medicine

## 2020-04-29 DIAGNOSIS — F17200 Nicotine dependence, unspecified, uncomplicated: Secondary | ICD-10-CM | POA: Insufficient documentation

## 2020-04-29 DIAGNOSIS — Z79890 Hormone replacement therapy (postmenopausal): Secondary | ICD-10-CM | POA: Diagnosis not present

## 2020-04-29 DIAGNOSIS — I10 Essential (primary) hypertension: Secondary | ICD-10-CM | POA: Diagnosis not present

## 2020-04-29 DIAGNOSIS — Z79899 Other long term (current) drug therapy: Secondary | ICD-10-CM | POA: Insufficient documentation

## 2020-04-29 DIAGNOSIS — E039 Hypothyroidism, unspecified: Secondary | ICD-10-CM | POA: Diagnosis not present

## 2020-04-29 DIAGNOSIS — R338 Other retention of urine: Secondary | ICD-10-CM

## 2020-04-29 DIAGNOSIS — R339 Retention of urine, unspecified: Secondary | ICD-10-CM | POA: Insufficient documentation

## 2020-04-29 LAB — URINALYSIS, ROUTINE W REFLEX MICROSCOPIC
Bilirubin Urine: NEGATIVE
Glucose, UA: NEGATIVE mg/dL
Ketones, ur: 5 mg/dL — AB
Leukocytes,Ua: NEGATIVE
Nitrite: NEGATIVE
Protein, ur: NEGATIVE mg/dL
Specific Gravity, Urine: 1.009 (ref 1.005–1.030)
pH: 7 (ref 5.0–8.0)

## 2020-04-29 MED ORDER — LORAZEPAM 1 MG PO TABS
1.0000 mg | ORAL_TABLET | Freq: Once | ORAL | Status: AC
  Administered 2020-04-29: 1 mg via ORAL
  Filled 2020-04-29: qty 1

## 2020-04-29 MED ORDER — LORAZEPAM 2 MG/ML IJ SOLN: 1.0000 mg | Freq: Once | INTRAMUSCULAR | Status: DC

## 2020-04-29 NOTE — Progress Notes (Addendum)
TOC CM spoke to pt at bedside. Offered choice for [facility]. Pt states her PCP was working on [facility]. Did inform her that [facility] declined referral. Explained [facility] accepted referral. Pt agreeable to [facility]. TOC CM received confirmation from Amy, [facility] HH rep. They are going to accept referral. Will give pt an update. Isidoro Donning RN CCM, [facility] ED TOC CM [phone]

## 2020-04-29 NOTE — ED Provider Notes (Signed)
[facility]-EMERGENCY DEPT Provider Note   CSN: [id number] Arrival date & time: 04/29/20  1122     History Chief Complaint  Patient presents with  . Urinary Retention    Marie Dominguez is a 69 y.o. female.  The history is provided by the patient and medical records. No language interpreter was used.     69 year old female with hx of ALS affecting mobility, kidney stone, recently diagnosed with urinary retention 3 days ago status post Foley placement presenting for evaluation of urinary retention. Pt was seen 4 days ago for urinary retention. Patient was seen in ED 4 days ago for urinary retention.  Work-up to include CT scan of her lumbar spine as well as neurology consultation.  It was felt that that her urinary retention is secondary to her ALS.  A Foley was placed at that time.  She presents today when she noticed no urine output from the Foley catheter as well as increased abdominal pain.  She request for Foley to be removed.  She denies any associated fever nausea vomiting or any new numbness.  She does follow-up with a neurologist.  Her pain is right now rated as 10 out of 10, sharp, throbbing, persistent.     Past Medical History:  Diagnosis Date  . Acid reflux   . Arthritis   . Hyperlipemia   . Hypertension   . Hypothyroidism   . Kidney stones   . Thyroid disease     Patient Active Problem List   Diagnosis Date Noted  . Carpal tunnel syndrome 01/13/2014  . 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 OTHER ACQUIRED DEFORMITY OF ANKLE AND FOOT OTHER 07/15/2009  . UNEQUAL LEG LENGTH 07/15/2009    Past Surgical History:  Procedure Laterality Date  . ABDOMINAL HYSTERECTOMY    . CYSTOSCOPY  08/15/2012   Procedure: CYSTOSCOPY;  Surgeon: Sebastian Ache, MD;  Location: [facility];  Service: Urology;  Laterality: N/A;  . FEMUR CLOSED REDUCTION    . NEPHROLITHOTOMY  08/15/2012   Procedure: NEPHROLITHOTOMY PERCUTANEOUS;  Surgeon: Sebastian Ache, MD;   Location: [facility];  Service: Urology;  Laterality: Left;  WITH SURGEON ACCESS   . NEPHROLITHOTOMY  08/17/2012   Procedure: NEPHROLITHOTOMY PERCUTANEOUS SECOND LOOK;  Surgeon: Sebastian Ache, MD;  Location: [facility];  Service: Urology;  Laterality: Left;  LEFT 2ND STAGE PCNL  . SHOULDER SURGERY       OB History   No obstetric history on file.     Family History  Problem Relation Age of Onset  . Cancer Other   . Breast cancer Maternal Aunt     Social History   Tobacco Use  . Smoking status: Current Every Day Smoker    Packs/day: 0.50    Years: 34.00    Pack years: 17.00    Last attempt to quit: 08/13/2002    Years since quitting: 17.7  . Smokeless tobacco: Never Used  Vaping Use  . Vaping Use: Never used  Substance Use Topics  . Alcohol use: Yes    Alcohol/week: 1.0 standard drink    Types: 1 Glasses of wine per week    Comment: daily-wine  . Drug use: No    Home Medications Prior to Admission medications   Medication Sig Start Date End Date Taking? Authorizing Provider  Cholecalciferol (VITAMIN D3 PO) Take 1 tablet by mouth daily.    [provider]  citalopram (CELEXA) 20 MG tablet Take 30 mg by mouth daily. 04/05/20   [provider]  dexlansoprazole (DEXILANT) 60 MG capsule Take 60 mg by mouth daily.    [provider]  Docusate Sodium (COLACE PO) Take 1 capsule by mouth at bedtime.    [provider]  fish oil-omega-3 fatty acids 1000 MG capsule Take 1 g by mouth 2 (two) times daily.     [provider]  Glucosamine-Chondroitin (MOVE FREE PO) Take 1 capsule by mouth at bedtime.    [provider]  ibuprofen (ADVIL) 200 MG tablet Take 400 mg by mouth every 6 (six) hours as needed for headache (pain).    [provider]  levothyroxine (SYNTHROID) 100 MCG tablet Take 100 mcg by mouth daily before breakfast.  04/05/20   [provider]  Magnesium 400 MG TABS Take 800 mg by mouth at bedtime.    [provider]  meloxicam (MOBIC) 15 MG tablet Take 15 mg by mouth daily as needed for pain.    [provider]  Multiple Vitamins-Minerals (MULTIVITAMIN WITH MINERALS) tablet Take 1 tablet by mouth daily. Centrum Silver for Women    [provider]  SIMETHICONE PO Take 1 tablet by mouth 2 (two) times daily.    [provider]  traMADol (ULTRAM) 50 MG tablet Take 1 tablet (50 mg total) by mouth every 6 (six) hours as needed. Patient not taking: Reported on 04/26/2020 04/21/20   Charlynne Pander, MD  trazodone (DESYREL) 300 MG tablet Take 300 mg by mouth at bedtime. 03/02/20   [provider]    Allergies    Gabapentin  Review of Systems   Review of Systems  All other systems reviewed and are negative.   Physical Exam Updated Vital Signs BP (!) 181/103 (BP Location: Left Arm)   Pulse 87   Temp 98.3 F (36.8 C) (Oral)   Resp 18   SpO2 97%   Physical Exam Vitals and nursing note reviewed.  Constitutional:      Appearance: She is well-developed.     Comments: Patient laying in bed appears uncomfortable  HENT:     Head: Atraumatic.  Eyes:     Conjunctiva/sclera: Conjunctivae normal.  Cardiovascular:     Rate and Rhythm: Normal rate and regular rhythm.     Pulses: Normal pulses.     Heart sounds: Normal heart sounds.  Pulmonary:     Effort: Pulmonary effort is normal.     Breath sounds: Normal breath sounds.  Abdominal:     Tenderness: There is abdominal tenderness ( Abdomen is distended with tenderness to suprapubic region).  Musculoskeletal:     Cervical back: Neck supple.     Comments: Able to move bilateral lower extremities with poor effort.  Skin:    Findings: No rash.  Neurological:     Mental Status: She is alert and oriented to person, place, and time.     ED Results / Procedures / Treatments   Labs (all labs ordered are listed, but only abnormal results are displayed) Labs Reviewed  URINALYSIS, ROUTINE W REFLEX MICROSCOPIC  - Abnormal; Notable for the following components:      Result Value   APPearance CLOUDY (*)    Hgb urine dipstick MODERATE (*)    Ketones, ur 5 (*)    Bacteria, UA FEW (*)    All other components within normal limits    EKG None  Radiology No results found.  Procedures BLADDER CATHETERIZATION  Date/Time: 04/29/2020 1:26 PM Performed by: Fayrene Helper, PA-C Authorized by: Fayrene Helper, PA-C  Consent:    Consent obtained:  Verbal   Consent given by:  Patient   Risks discussed:  False passage, urethral injury and pain   Alternatives discussed:  No treatment Pre-procedure details:    Procedure purpose:  Therapeutic Anesthesia (see MAR for exact dosages):    Anesthesia method:  None Procedure details:    Provider performed due to:  Complicated insertion   Catheter insertion:  Temporary indwelling   Catheter type:  Foley   Catheter size:  14 Fr   Bladder irrigation: no     Number of attempts:  1   Urine characteristics:  Yellow Post-procedure details:    Patient tolerance of procedure:  Tolerated well, no immediate complications   (including critical care time)  Medications Ordered in ED Medications  LORazepam (ATIVAN) tablet 1 mg (1 mg Oral Given 04/29/20 1336)    ED Course  I have reviewed the triage vital signs and the nursing notes.  Pertinent labs & imaging results that were available during my care of the patient were reviewed by me and considered in my medical decision making (see chart for details).    MDM Rules/Calculators/A&P                          BP (!) 141/71   Pulse 80   Temp 98.3 F (36.8 C) (Oral)   Resp 16   SpO2 100%   Final Clinical Impression(s) / ED Diagnoses Final diagnoses:  Acute urinary retention    Rx / DC Orders ED Discharge Orders         Ordered    Home Health     Discontinue  Reprint     04/29/20 1310    Face-to-face encounter (required for Medicare/Medicaid patients)     Discontinue  Reprint    Comments: I Fayrene Helper  certify that this patient is under my care and that I, or a nurse practitioner or physician's assistant working with me, had a face-to-face encounter that meets the physician face-to-face encounter requirements with this patient on 04/29/2020. The encounter with the patient was in whole, or in part for the following medical condition(s) which is the primary reason for home health care (List medical condition): ALS causing decrease motility.  New Foley catheter insertion needing care.   04/29/20 1310         Patient with history of ALS here with urinary retention.  She has a Foley placed 4 days ago but it was not properly drained today causing discomfort.  Patient request Foley catheter to be removed.  Foley was removed and pt did try urinating without success.  A bladder scan performed demonstrating greater than 800 cc of retained urine.  Foley was placed by me without any difficulty, normal-appearing urine was noted in urine bag.  Patient reported immediate relief of pressure about her abdomen.  Anticipate discharge with Foley catheter and for patient to follow-up with a neurologist for further management of her ALS.  Social worker is also involved in patient care and recommend face-to-face PT/OT/RN/Foley care outpatient.  2:10 PM UA with evidence of hematuria, likely 2/2 bladder distension from acute urinary retention.  No evidence of infection.  Pt otherwise stable for discharge. Care discussed with Dr. Donnald Garre.    Fayrene Helper, PA-C 04/29/20 1411    Arby Barrette, MD 04/29/20 [phone]

## 2020-04-29 NOTE — ED Notes (Signed)
RN and NT went to go replace catheter and patient refused to allow a new catheter to be placed at this time.

## 2020-04-29 NOTE — ED Triage Notes (Signed)
Patient reports to the ER for catheter blockage. Patient unable to have any urine drained from bladder via catheter and catheter unable to be flushed

## 2020-04-29 NOTE — Medical Student Note (Addendum)
[facility]-EMERGENCY DEPT Provider Student Note For educational purposes for Medical, PA and NP students only and not part of the legal medical record.   CSN: [id number] Arrival date & time: 04/29/20  1122      History   Chief Complaint Chief Complaint  Patient presents with  . Urinary Retention    HPI An Marie Dominguez is a 69 y.o. female hx of ALS and nephrolithiasis  Cath inserted on Sunday, was waiting for home health to follow up but was removed today by nursing staff due to "extreme pain"  per the patient. She started feeling pain and pressure this morning and noted some leakage outside of the catheter. She does not want a new catheter placed and feels that she is able to urinate on her own. She is willing to trial urinal use and then be discharged. She was using urinating a little bit in the bed and noted some pain. Denies burning or itching or flank pain.   Post urination bladder scan:  HPI  Past Medical History:  Diagnosis Date  . Acid reflux   . Arthritis   . Hyperlipemia   . Hypertension   . Hypothyroidism   . Kidney stones   . Thyroid disease     Patient Active Problem List   Diagnosis Date Noted  . Carpal tunnel syndrome 01/13/2014  . 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 OTHER ACQUIRED DEFORMITY OF ANKLE AND FOOT OTHER 07/15/2009  . UNEQUAL LEG LENGTH 07/15/2009    Past Surgical History:  Procedure Laterality Date  . ABDOMINAL HYSTERECTOMY    . CYSTOSCOPY  08/15/2012   Procedure: CYSTOSCOPY;  Surgeon: Sebastian Ache, MD;  Location: [facility];  Service: Urology;  Laterality: N/A;  . FEMUR CLOSED REDUCTION    . NEPHROLITHOTOMY  08/15/2012   Procedure: NEPHROLITHOTOMY PERCUTANEOUS;  Surgeon: Sebastian Ache, MD;  Location: [facility];  Service: Urology;  Laterality: Left;  WITH SURGEON ACCESS   . NEPHROLITHOTOMY  08/17/2012   Procedure: NEPHROLITHOTOMY PERCUTANEOUS SECOND LOOK;  Surgeon: Sebastian Ache, MD;  Location: [facility];  Service: Urology;  Laterality:  Left;  LEFT 2ND STAGE PCNL  . SHOULDER SURGERY      OB History   No obstetric history on file.      Home Medications    Prior to Admission medications   Medication Sig Start Date End Date Taking? Authorizing Provider  Cholecalciferol (VITAMIN D3 PO) Take 1 tablet by mouth daily.    [provider]  citalopram (CELEXA) 20 MG tablet Take 30 mg by mouth daily. 04/05/20   [provider]  dexlansoprazole (DEXILANT) 60 MG capsule Take 60 mg by mouth daily.    [provider]  Docusate Sodium (COLACE PO) Take 1 capsule by mouth at bedtime.    [provider]  fish oil-omega-3 fatty acids 1000 MG capsule Take 1 g by mouth 2 (two) times daily.     [provider]  Glucosamine-Chondroitin (MOVE FREE PO) Take 1 capsule by mouth at bedtime.    [provider]  ibuprofen (ADVIL) 200 MG tablet Take 400 mg by mouth every 6 (six) hours as needed for headache (pain).    [provider]  levothyroxine (SYNTHROID) 100 MCG tablet Take 100 mcg by mouth daily before breakfast.  04/05/20   [provider]  Magnesium 400 MG TABS Take 800 mg by mouth at bedtime.    [provider]  meloxicam (MOBIC) 15 MG tablet Take 15 mg by mouth daily  as needed for pain.    [provider]  Multiple Vitamins-Minerals (MULTIVITAMIN WITH MINERALS) tablet Take 1 tablet by mouth daily. Centrum Silver for Women    [provider]  SIMETHICONE PO Take 1 tablet by mouth 2 (two) times daily.    [provider]  traMADol (ULTRAM) 50 MG tablet Take 1 tablet (50 mg total) by mouth every 6 (six) hours as needed. Patient not taking: Reported on 04/26/2020 04/21/20   Charlynne Pander, MD  trazodone (DESYREL) 300 MG tablet Take 300 mg by mouth at bedtime. 03/02/20   [provider]    Family History Family History  Problem Relation Age of Onset  . Cancer Other   . Breast cancer Maternal Aunt     Social History Social  History   Tobacco Use  . Smoking status: Current Every Day Smoker    Packs/day: 0.50    Years: 34.00    Pack years: 17.00    Last attempt to quit: 08/13/2002    Years since quitting: 17.7  . Smokeless tobacco: Never Used  Vaping Use  . Vaping Use: Never used  Substance Use Topics  . Alcohol use: Yes    Alcohol/week: 1.0 standard drink    Types: 1 Glasses of wine per week    Comment: daily-wine  . Drug use: No     Allergies   Gabapentin   Review of Systems Review of Systems   Physical Exam Updated Vital Signs BP (!) 181/103 (BP Location: Left Arm)   Pulse 87   Temp 98.3 F (36.8 C) (Oral)   Resp 18   SpO2 97%   Physical Exam   ED Treatments / Results  Labs (all labs ordered are listed, but only abnormal results are displayed) Labs Reviewed - No data to display  EKG  Radiology No results found.  Procedures Procedures (including critical care time)  Medications Ordered in ED Medications - No data to display   Initial Impression / Assessment and Plan / ED Course  I have reviewed the triage vital signs and the nursing notes.  Pertinent labs & imaging results that were available during my care of the patient were reviewed by me and considered in my medical decision making (see chart for details).   Final Clinical Impressions(s) / ED Diagnoses   Final diagnoses:  None    New Prescriptions New Prescriptions   No medications on file

## 2020-04-29 NOTE — ED Notes (Signed)
[facility] called for transport home. 

## 2020-04-29 NOTE — Discharge Instructions (Signed)
Your urinary retention is likely due to complication from ALS.  Call and follow up closely with your neurologist for further management.  You may follow up with [facility] for reassessment of your foley catheter need.  I have request for assistance with physical therapy, occupational therapy and nursing help for you.  You should receive a phone call in the next few days.  Return if you have any concerns.

## 2020-05-11 ENCOUNTER — Encounter (HOSPITAL_COMMUNITY): Payer: Self-pay

## 2020-05-11 ENCOUNTER — Other Ambulatory Visit: Payer: Self-pay

## 2020-05-11 ENCOUNTER — Emergency Department (HOSPITAL_COMMUNITY)
Admission: EM | Admit: 2020-05-11 | Discharge: 2020-05-11 | Disposition: A | Discharge status: Home / Self Care | Payer: Medicare Other | Attending: Emergency Medicine | Admitting: Emergency Medicine

## 2020-05-11 DIAGNOSIS — N3289 Other specified disorders of bladder: Secondary | ICD-10-CM | POA: Diagnosis present

## 2020-05-11 DIAGNOSIS — R531 Weakness: Secondary | ICD-10-CM | POA: Diagnosis not present

## 2020-05-11 DIAGNOSIS — E039 Hypothyroidism, unspecified: Secondary | ICD-10-CM | POA: Diagnosis not present

## 2020-05-11 DIAGNOSIS — I10 Essential (primary) hypertension: Secondary | ICD-10-CM | POA: Diagnosis not present

## 2020-05-11 DIAGNOSIS — R301 Vesical tenesmus: Secondary | ICD-10-CM

## 2020-05-11 DIAGNOSIS — F17200 Nicotine dependence, unspecified, uncomplicated: Secondary | ICD-10-CM | POA: Insufficient documentation

## 2020-05-11 LAB — URINALYSIS, ROUTINE W REFLEX MICROSCOPIC
Bilirubin Urine: NEGATIVE
Glucose, UA: NEGATIVE mg/dL
Hgb urine dipstick: NEGATIVE
Ketones, ur: NEGATIVE mg/dL
Nitrite: POSITIVE — AB
Protein, ur: NEGATIVE mg/dL
Specific Gravity, Urine: 1.013 (ref 1.005–1.030)
pH: 7 (ref 5.0–8.0)

## 2020-05-11 NOTE — Discharge Instructions (Signed)
You most likely had a bladder spasm today.  If this happens again you can take a dose of your ativan which may help.  No signs of significant infection in your urine today but a culture was done and urology can follow up on Wednesday.

## 2020-05-11 NOTE — ED Notes (Signed)
[facility] called for transport home. 

## 2020-05-11 NOTE — ED Triage Notes (Signed)
Pt brought in by [facility] from home. Pt has catheter dislodged. She felt it move yesterday when she went to the bathroom. Pt reports it is leaking today and feels pressure and pain when urinating. Pt reports no known exposure to COVID and is fully vaccinated.   BP 130/80 75 HR 16 RR 97% RA 97.1

## 2020-05-11 NOTE — ED Provider Notes (Signed)
[facility]-EMERGENCY DEPT Provider Note   CSN: [id number] Arrival date & time: 05/11/20  1229     History Chief Complaint  Patient presents with  . Catheter issues    Makaleigh Reinard Hing is a 69 y.o. female.  Patient is a 69 year old female with a history of ALS who is becoming more immobile and now having difficulty ambulating, hypertension and hypothyroidism who had a fall in July and since that time has had urinary retention and required Foley catheter.  Patient had to have the catheter replaced 1 time 2 weeks ago for obstruction.  She reports she has had some dysuria over the last few days and started using Pyridium but today she got an intense pain in her vaginal area near the catheter and the pain became severe a 10 out of 10 and she suddenly started urinating around the catheter and filled up her Foley bag.  She reports now the pain is about a 4 out of 10 but still uncomfortable in her vaginal area.  She has no longer had pressure in her abdomen.  She plans on following up with urology on Wednesday but due to the discomfort came for further evaluation.  She did take Ativan before coming but has not started any new medications.  She denies any trauma or the catheter being caught on anything.  The history is provided by the patient.       Past Medical History:  Diagnosis Date  . Acid reflux   . Arthritis   . Hyperlipemia   . Hypertension   . Hypothyroidism   . Kidney stones   . Thyroid disease     Patient Active Problem List   Diagnosis Date Noted  . Carpal tunnel syndrome 01/13/2014  . 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 OTHER ACQUIRED DEFORMITY OF ANKLE AND FOOT OTHER 07/15/2009  . UNEQUAL LEG LENGTH 07/15/2009    Past Surgical History:  Procedure Laterality Date  . ABDOMINAL HYSTERECTOMY    . CYSTOSCOPY  08/15/2012   Procedure: CYSTOSCOPY;  Surgeon: Sebastian Ache, MD;  Location: [facility];  Service: Urology;  Laterality: N/A;  . FEMUR  CLOSED REDUCTION    . NEPHROLITHOTOMY  08/15/2012   Procedure: NEPHROLITHOTOMY PERCUTANEOUS;  Surgeon: Sebastian Ache, MD;  Location: [facility];  Service: Urology;  Laterality: Left;  WITH SURGEON ACCESS   . NEPHROLITHOTOMY  08/17/2012   Procedure: NEPHROLITHOTOMY PERCUTANEOUS SECOND LOOK;  Surgeon: Sebastian Ache, MD;  Location: [facility];  Service: Urology;  Laterality: Left;  LEFT 2ND STAGE PCNL  . SHOULDER SURGERY       OB History   No obstetric history on file.     Family History  Problem Relation Age of Onset  . Cancer Other   . Breast cancer Maternal Aunt     Social History   Tobacco Use  . Smoking status: Current Every Day Smoker    Packs/day: 0.50    Years: 34.00    Pack years: 17.00    Last attempt to quit: 08/13/2002    Years since quitting: 17.7  . Smokeless tobacco: Never Used  Vaping Use  . Vaping Use: Never used  Substance Use Topics  . Alcohol use: Yes    Alcohol/week: 1.0 standard drink    Types: 1 Glasses of wine per week    Comment: daily-wine  . Drug use: No    Home Medications Prior to Admission medications   Medication Sig Start Date End Date Taking? Authorizing Provider  Cholecalciferol (VITAMIN  D3 PO) Take 1 tablet by mouth daily.    [provider]  citalopram (CELEXA) 20 MG tablet Take 30 mg by mouth daily. 04/05/20   [provider]  dexlansoprazole (DEXILANT) 60 MG capsule Take 60 mg by mouth daily.    [provider]  Docusate Sodium (COLACE PO) Take 1 capsule by mouth at bedtime.    [provider]  fish oil-omega-3 fatty acids 1000 MG capsule Take 1 g by mouth 2 (two) times daily.     [provider]  Glucosamine-Chondroitin (MOVE FREE PO) Take 1 capsule by mouth at bedtime.    [provider]  ibuprofen (ADVIL) 200 MG tablet Take 400 mg by mouth every 6 (six) hours as needed for headache (pain).    [provider]  levothyroxine (SYNTHROID) 100 MCG tablet Take 100 mcg by mouth  daily before breakfast.  04/05/20   [provider]  Magnesium 400 MG TABS Take 800 mg by mouth at bedtime.    [provider]  meloxicam (MOBIC) 15 MG tablet Take 15 mg by mouth daily as needed for pain.    [provider]  Multiple Vitamins-Minerals (MULTIVITAMIN WITH MINERALS) tablet Take 1 tablet by mouth daily. Centrum Silver for Women    [provider]  SIMETHICONE PO Take 1 tablet by mouth 2 (two) times daily.    [provider]  traMADol (ULTRAM) 50 MG tablet Take 1 tablet (50 mg total) by mouth every 6 (six) hours as needed. Patient not taking: Reported on 04/26/2020 04/21/20   Charlynne Pander, MD  trazodone (DESYREL) 300 MG tablet Take 300 mg by mouth at bedtime. 03/02/20   [provider]    Allergies    Gabapentin  Review of Systems   Review of Systems  All other systems reviewed and are negative.   Physical Exam Updated Vital Signs BP 131/72   Pulse 72   Temp 98.5 F (36.9 C)   Resp 18   SpO2 98%   Physical Exam Vitals and nursing note reviewed.  Constitutional:      General: She is not in acute distress.    Appearance: Normal appearance. She is well-developed and normal weight.  HENT:     Head: Normocephalic and atraumatic.     Nose: Nose normal.  Eyes:     Pupils: Pupils are equal, round, and reactive to light.  Cardiovascular:     Rate and Rhythm: Normal rate and regular rhythm.     Heart sounds: Normal heart sounds. No murmur heard.  No friction rub.  Pulmonary:     Effort: Pulmonary effort is normal.     Breath sounds: Normal breath sounds. No wheezing or rales.  Abdominal:     General: Bowel sounds are normal. There is no distension.     Palpations: Abdomen is soft.     Tenderness: There is abdominal tenderness. There is no guarding or rebound.     Comments: Minimal suprapubic discomfort  Genitourinary:    Comments: Foley catheter appears intact in the urethra.  No surrounding edema or bleeding  but uncomfortable with slight movement of the catheter.  Urine draining into the Foley.  No leakage of urine at this time. Musculoskeletal:        General: No tenderness. Normal range of motion.     Comments: No edema  Skin:    General: Skin is warm and dry.     Findings: No rash.  Neurological:     Mental  Status: She is alert and oriented to person, place, and time.     Cranial Nerves: No cranial nerve deficit.     Motor: Weakness present.     Comments: 3 out of 5 strength in bilateral lower extremities  Psychiatric:        Mood and Affect: Mood normal.        Behavior: Behavior normal.        Thought Content: Thought content normal.     ED Results / Procedures / Treatments   Labs (all labs ordered are listed, but only abnormal results are displayed) Labs Reviewed  URINALYSIS, ROUTINE W REFLEX MICROSCOPIC - Abnormal; Notable for the following components:      Result Value   APPearance HAZY (*)    Nitrite POSITIVE (*)    Leukocytes,Ua SMALL (*)    Bacteria, UA FEW (*)    All other components within normal limits  URINE CULTURE    EKG None  Radiology No results found.  Procedures Procedures (including critical care time)  Medications Ordered in ED Medications - No data to display  ED Course  I have reviewed the triage vital signs and the nursing notes.  Pertinent labs & imaging results that were available during my care of the patient were reviewed by me and considered in my medical decision making (see chart for details).    MDM Rules/Calculators/A&P                          Patient presenting complaining of Foley catheter issues.  She is having intense vaginal pain and initially had abdominal pressure and leaking of urine around the catheter and into the Foley catheter bag.  Patient has had over a liter of urine out today.  There is no leakage of urine at this time.  She was having some dysuria but denies any systemic urinary complaints.  Concern for possible  bladder spasm versus developing UTI.  Bladder scan shows a volume of 9 mL and no evidence of retention at this time.  Will flush the catheter to ensure appropriate placement but may need to replace to see if that helps with discomfort.  UA pending.  Vital signs are reassuring.  3:23 PM UA is positive for nitrites and has small leukocytes however most likely related to taking AZO.  White cells are still 11-20 with few bacteria.  No significant change from prior UAs.  A culture was done but at this time no convincing evidence for UTI.  Patient symptoms have resolved and now she has only minimal discomfort.  Suspect patient had a bladder spasm.  Encourage patient to follow-up with urology on Wednesday and they can follow-up on urine culture at that time.  She is stable for discharge.  MDM Number of Diagnoses or Management Options   Amount and/or Complexity of Data Reviewed Clinical lab tests: ordered and reviewed Review and summarize past medical records: yes Independent visualization of images, tracings, or specimens: yes  Risk of Complications, Morbidity, and/or Mortality Presenting problems: low Diagnostic procedures: minimal Management options: minimal  Patient Progress Patient progress: stable    Final Clinical Impression(s) / ED Diagnoses Final diagnoses:  Painful bladder spasm    Rx / DC Orders ED Discharge Orders    None       Gwyneth Sprout, MD 05/11/20 1530

## 2020-05-12 LAB — URINE CULTURE: Culture: >=100000 — AB

## 2020-05-13 ENCOUNTER — Telehealth: Payer: Self-pay | Admitting: Emergency Medicine

## 2020-05-13 NOTE — Progress Notes (Signed)
ED Antimicrobial Stewardship Positive Culture Follow Up   Marie Dominguez is an 69 y.o. female who presented to [facility] on 05/11/2020 with a chief complaint of  Chief Complaint  Patient presents with  . Catheter issues    Recent Results (from the past 720 hour(s))  Urine culture     Status: None   Collection Time: 04/26/20 11:44 AM   Specimen: Urine, Random  Result Value Ref Range Status   Specimen Description   Final    URINE, RANDOM Performed at [facility], [address] [place], [place] [address]    Special Requests   Final    URINE, RANDOM Performed at [facility], [address], [place], [place] [address]    Culture   Final    NO GROWTH Performed at [facility], [address], [place], [place] [address]    Report Status 04/27/2020 FINAL  Final  Urine Culture     Status: Abnormal   Collection Time: 05/11/20  1:26 PM   Specimen: Urine, Catheterized  Result Value Ref Range Status   Specimen Description   Final    URINE, CATHETERIZED Performed at [facility], [address], [place], [place] [address]    Special Requests   Final    NONE Performed at [facility], [address], [place], [place] [address]    Culture (A)  Final    >=100,000 COLONIES/mL DIPHTHEROIDS(CORYNEBACTERIUM SPECIES) Standardized susceptibility testing for this organism is not available. Performed at [facility], [address] [place], [place] [address]    Report Status 05/12/2020 FINAL  Final    [x]  Patient discharged originally without antimicrobial agent   Plan: 1. Pt has appointment with urology today (05/13/20). 2. Urine culture faxed to [facility] to attention: 05/15/20, NP  ED Provider: Anne Fu  Clelia Schaumann, PharmD Candidate 05/13/2020 10:58 AM

## 2020-05-13 NOTE — Telephone Encounter (Signed)
Post ED Visit - Positive Culture Follow-up  Culture report reviewed by antimicrobial stewardship pharmacist: [facility] []  , Pharm.D. []  Enzo Bi, Pharm.D., BCPS AQ-ID []  , Pharm.D., BCPS []  Celedonio Miyamoto, Pharm.D., BCPS []  Tice, Garvin Fila.D., BCPS, AAHIVP []  , Pharm.D., BCPS, AAHIVP []  Georgina Pillion, PharmD, BCPS []  , PharmD, BCPS []  [place], PharmD, BCPS []  [place], PharmD []  , PharmD, BCPS []  Estella Husk, PharmD  Pharmacy Team [x]  Lysle Pearl, PharmD []  , PharmD []  Phillips Climes, PharmD []  , Rph []  Agapito Games) , PharmD []  Verlan Friends, PharmD []  , PharmD []  Mervyn Gay, PharmD []  , PharmD []  Vinnie Level, PharmD []  Wonda Olds, PharmD []  , PharmD []  Len Childs, PharmD   Positive urine culture Treated with none, report faxed to [facility], has appt today 8/18 @ 10:45 ,no further patient follow-up is required at this time.  Greer Pickerel 05/13/2020, 11:16 AM

## 2020-05-13 NOTE — Progress Notes (Signed)
Virtual Visit via Video Note The purpose of this virtual visit is to provide medical care while limiting exposure to the novel coronavirus.    Consent was obtained for video visit:  Yes.   Answered questions that patient had about telehealth interaction:  Yes.   I discussed the limitations, risks, security and privacy concerns of performing an evaluation and management service by telemedicine. I also discussed with the patient that there may be a patient responsible charge related to this service. The patient expressed understanding and agreed to proceed.  Pt location: Home Physician Location: office Name of referring provider:  Shirlean Mylar, MD I connected with Georga Hacking Nath at patients initiation/request on 05/14/2020 at  2:30 PM EDT by video enabled telemedicine application and verified that I am speaking with the correct person using two identifiers. Pt MRN:  [id number] Pt DOB:  1950-10-04 Video Participants:  Georga Hacking Meder   History of Present Illness:  Marie Dominguez. Dolman is a 69 year old Caucasian female with HTN, hypothyroidism, HLD and tobacco use disorder whofollows up for ALS.  UPDATE: She was referred to the ALS clinic at [facility].  About 3 weeks ago, she stood up and tripped, fracturing her left foot.  About a ween later, she developed urinary retention and required a catheter.  It subsequently clotted and needed to be replaced.  She just had the catheter removed yesterday.  She has a motorized wheelchair.  Her friends help care for her.  She has good days and bad days.  Her antidepressant was increased.  She still eats well.  Symptoms are all still lower extremity.   HISTORY: In December 2019, she started limpingwhile on a cruise. She started using a cane. Then around August 2020, she started having trouble keeping her balance. In late September, her balance progressively got worse, requiring a rolling walker. She noticed that she had a left foot drop. Her legs feel weak but  denies pain or weakness. After about 20 minutes, she gets tired. She denies double vision, slurred speech, dysphagia, upper extremity weakness, neck weakness.Physical therapywas ineffective. She takes Mobic for arthritic back pain.   In 1966, she fractured her femur in which herrightleg became shorter. She is unable to get an MRI due to a screw left from when she had surgery for the fractured femur.She has history of low back painand degenerative disc diseaseand underwent epidural injections for L4 radiculopathy in 2018.  She underwent workup: 09/03/2019 LABS: ANA negative; CK 106; aldolase 4.8; TSH 0.70; B12 629; copper 138; zinc 58. 10/17/2019 MRI CERVICAL WO: Multilevel degenerative changes with advanced foraminal stenosis but only mild canal stenosis with no abnormality of the cord to suggest myelopathy. 10/17/2019 MRI THORACIC WO: Mild degenerative changes but no high-grade canal stenosis or cord impingement. 10/17/2019 MRI LUMBAR WO: Degenerative changes at L2-3 level with mild canal stenosis, left-greater than right subarticular recess stenosis, and mild-to-moderate left foraminal stenosis; mild diffuse disc bulge at L4-L5 with mild canal stenosis and bilateral subarticular recess stenosis; and mild diffuse disc bulge at L5-S1 with moderate left foraminal stenosis but overall unremarkable. 11/05/2019 NCV-EMG LOWER EXTREMITIES:"severe active on chronic, multilevel intraspinal canal lesion process affecting the L3-S1 nerve roots/segments bilaterally and lower thoracic level ( i.e. radiculopathy, disorder of anterior horn cells, etc). 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   Past Medical History: Past Medical History:  Diagnosis Date  . Acid reflux   . Arthritis   . Hyperlipemia   . Hypertension   . Hypothyroidism   . Kidney stones   . Thyroid disease     Medications: Outpatient Encounter Medications as of 05/14/2020  Medication Sig Note  . Cholecalciferol (VITAMIN D3 PO) Take 1 tablet by mouth daily.   . citalopram (CELEXA) 20 MG tablet Take 30 mg by mouth daily.   Marland Kitchen dexlansoprazole (DEXILANT) 60 MG capsule Take 60 mg by mouth daily.   Tery Sanfilippo Sodium (COLACE PO) Take 1 capsule by mouth at bedtime.   . fish oil-omega-3 fatty acids 1000 MG capsule Take 1 g by mouth 2 (two) times daily.    . Glucosamine-Chondroitin (MOVE FREE PO) Take 1 capsule by mouth at bedtime.   Marland Kitchen ibuprofen (ADVIL) 200 MG tablet Take 400 mg by mouth every 6 (six) hours as needed for headache (pain).   Marland Kitchen levothyroxine (SYNTHROID) 100 MCG tablet Take 100 mcg by mouth daily before breakfast.    . Magnesium 400 MG TABS Take 800 mg by mouth at bedtime.   . meloxicam (MOBIC) 15 MG tablet Take 15 mg by mouth daily as needed for pain.   . Multiple Vitamins-Minerals (MULTIVITAMIN WITH MINERALS) tablet Take 1 tablet by mouth daily. Centrum Silver for Women 08/28/2013: .   Marland Kitchen SIMETHICONE PO Take 1 tablet by mouth 2 (two) times daily.   . traMADol (ULTRAM) 50 MG tablet Take 1 tablet (50 mg total) by mouth every 6 (six) hours as needed. (Patient not taking: Reported on 04/26/2020)   . trazodone (DESYREL) 300 MG tablet Take 300 mg by mouth at bedtime.    No facility-administered encounter medications on file as of 05/14/2020.    Allergies: Allergies  Allergen Reactions  . Gabapentin Itching    Family History: Family History  Problem Relation Age of Onset  . Cancer Other   . Breast cancer Maternal Aunt     Social History: Social History   Socioeconomic History  . Marital status: Single    Spouse name: Not  on file  . Number of children: 1  . Years of education: Not on file  . Highest education level: High school graduate  Occupational History  . Not on file  Tobacco Use  . Smoking status: Current Every Day Smoker    Packs/day: 0.50    Years: 34.00    Pack years: 17.00    Last attempt to quit: 08/13/2002    Years since quitting: 17.7  . Smokeless tobacco: Never Used  Vaping Use  . Vaping Use: Never used  Substance and Sexual Activity  . Alcohol use: Yes    Alcohol/week: 1.0 standard drink    Types: 1 Glasses of wine per week    Comment: daily-wine  . Drug use: No  . Sexual activity: Yes  Other Topics Concern  . Not on file  Social History Narrative   Single   1 child   Right handed   Drinks coffee, soda once a week mostly water   Social Determinants of Health   Financial Resource Strain:   . Difficulty of Paying Living Expenses:   Food Insecurity:   . Worried About Programme researcher, broadcasting/film/video in the Last Year:   . [employer] of Food in the Last Year:  Transportation Needs:   . Freight forwarder (Medical):   Marland Kitchen Lack of Transportation (Non-Medical):   Physical Activity:   . Days of Exercise per Week:   . Minutes of Exercise per Session:   Stress:   . Feeling of Stress :   Social Connections:   . Frequency of Communication with Friends and Family:   . Frequency of Social Gatherings with Friends and Family:   . Attends Religious Services:   . Active Member of Clubs or Organizations:   . Attends Banker Meetings:   Marland Kitchen Marital Status:   Intimate Partner Violence:   . Fear of Current or Ex-Partner:   . Emotionally Abused:   Marland Kitchen Physically Abused:   . Sexually Abused:     Observations/Objective:   Height 5\' 7"  (1.702 m), weight 131 lb (59.4 kg). No acute distress.  Alert and oriented.  Speech fluent and not dysarthric.  Language intact.    Assessment and Plan:   ALS  Continue follow up at [facility] Follow up in 6 months.  Follow Up  Instructions:    -I discussed the assessment and treatment plan with the patient. The patient was provided an opportunity to ask questions and all were answered. The patient agreed with the plan and demonstrated an understanding of the instructions.   The patient was advised to call back or seek an in-person evaluation if the symptoms worsen or if the condition fails to improve as anticipated.   [facility], DO

## 2020-05-14 ENCOUNTER — Encounter: Payer: Self-pay | Admitting: Neurology

## 2020-05-14 ENCOUNTER — Other Ambulatory Visit: Payer: Self-pay

## 2020-05-14 ENCOUNTER — Telehealth (INDEPENDENT_AMBULATORY_CARE_PROVIDER_SITE_OTHER): Payer: Medicare Other | Admitting: Neurology

## 2020-05-14 VITALS — Ht 67.0 in | Wt 131.0 lb

## 2020-05-14 DIAGNOSIS — G1221 Amyotrophic lateral sclerosis: Secondary | ICD-10-CM

## 2020-10-01 DIAGNOSIS — Z79899 Other long term (current) drug therapy: Secondary | ICD-10-CM | POA: Diagnosis not present

## 2020-10-01 DIAGNOSIS — M1711 Unilateral primary osteoarthritis, right knee: Secondary | ICD-10-CM | POA: Diagnosis not present

## 2020-10-01 DIAGNOSIS — Z7401 Bed confinement status: Secondary | ICD-10-CM | POA: Diagnosis not present

## 2020-10-01 DIAGNOSIS — Z791 Long term (current) use of non-steroidal anti-inflammatories (NSAID): Secondary | ICD-10-CM | POA: Diagnosis not present

## 2020-10-01 DIAGNOSIS — G1221 Amyotrophic lateral sclerosis: Secondary | ICD-10-CM | POA: Diagnosis not present

## 2020-10-25 DIAGNOSIS — G1221 Amyotrophic lateral sclerosis: Secondary | ICD-10-CM | POA: Diagnosis not present

## 2020-11-13 NOTE — Progress Notes (Signed)
NEUROLOGY FOLLOW UP OFFICE NOTE  Marie Dominguez [id number]   Subjective:  Marie Dominguez is a 70 year old Caucasian female with HTN, hypothyroidism, HLD and tobacco use disorder whofollows up for ALS.  She is accompanied by her friend who supplements history.  ALS clinic notes reviewed.  UPDATE: She is treated in the ALS clinic at [facility].  Trilogy 4 times a day.  Sleeps well.  She is nonambulatory.  She uses a motorized chair.  Sometimes she feels short of breath but infrequent.  She uses the trilogy 4 times a day.  She sees the respiratory therapist every couple of months.  Appetite is good.  She is under the care of Dr. Gwenyth Ober of palliative care.    HISTORY: In December 2019, she started limpingwhile on a cruise. She started using a cane. Then around August 2020, she started having trouble keeping her balance. In late September, her balance progressively got worse, requiring a rolling walker. She noticed that she had a left foot drop. Her legs feel weak but denies pain or weakness. After about 20 minutes, she gets tired. She denies double vision, slurred speech, dysphagia, upper extremity weakness, neck weakness.Physical therapywas ineffective. She takes Mobic for arthritic back pain.   In 1966, she fractured her femur in which herrightleg became shorter. She is unable to get an MRI due to a screw left from when she had surgery for the fractured femur.She has history of low back painand degenerative disc diseaseand underwent epidural injections for L4 radiculopathy in 2018.  She underwent workup: 09/03/2019 LABS: ANA negative; CK 106; aldolase 4.8; TSH 0.70; B12 629; copper 138; zinc 58. 10/17/2019 MRI CERVICAL WO: Multilevel degenerative changes with advanced foraminal stenosis but only mild canal stenosis with no abnormality of the cord to suggest myelopathy. 10/17/2019 MRI THORACIC WO: Mild degenerative changes but no high-grade canal stenosis  or cord impingement. 10/17/2019 MRI LUMBAR WO: Degenerative changes at L2-3 level with mild canal stenosis, left-greater than right subarticular recess stenosis, and mild-to-moderate left foraminal stenosis; mild diffuse disc bulge at L4-L5 with mild canal stenosis and bilateral subarticular recess stenosis; and mild diffuse disc bulge at L5-S1 with moderate left foraminal stenosis but overall unremarkable. 11/05/2019 NCV-EMG LOWER EXTREMITIES:"severe active on chronic, multilevel intraspinal canal lesion process affecting the L3-S1 nerve roots/segments bilaterally and lower thoracic level ( i.e. radiculopathy, disorder of anterior horn cells, etc). There is no evidence of a sensorimotor polyneuropathy or myopathy affecting the lower extremities." 11/12/2019 NCV EMG CERVICAL/RIGHT UPPER EXTREMITY & THORACIC REGIONS:  In combination with results from electrodiagnostic testing of the legs on 11/05/2019, findings are consistent with a widespread disorder of anterior horn cells affecting the cervical, thoracic, and lumbar region, supportive of motor neuron disease.   PAST MEDICAL HISTORY: Past Medical History:  Diagnosis Date  . Acid reflux   . Arthritis   . Hyperlipemia   . Hypertension   . Hypothyroidism   . Kidney stones   . Thyroid disease     MEDICATIONS: Current Outpatient Medications on File Prior to Visit  Medication Sig Dispense Refill  . Cholecalciferol (VITAMIN D3 PO) Take 1 tablet by mouth daily.    . citalopram (CELEXA) 20 MG tablet Take 40 mg by mouth daily.     Marland Kitchen dexlansoprazole (DEXILANT) 60 MG capsule Take 60 mg by mouth daily.    Tery Sanfilippo Sodium (COLACE PO) Take 1 capsule by mouth at bedtime.    . fish oil-omega-3 fatty acids 1000 MG capsule  Take 1 g by mouth 2 (two) times daily.     . Glucosamine-Chondroitin (MOVE FREE PO) Take 1 capsule by mouth at bedtime.    Marland Kitchen ibuprofen (ADVIL) 200 MG tablet Take 400 mg by mouth every 6 (six) hours as needed for headache (pain).    Marland Kitchen  levothyroxine (SYNTHROID) 100 MCG tablet Take 100 mcg by mouth daily before breakfast.     . Magnesium 400 MG TABS Take 800 mg by mouth at bedtime. (Patient not taking: Reported on 05/14/2020)    . meloxicam (MOBIC) 15 MG tablet Take 15 mg by mouth daily as needed for pain.    . Multiple Vitamins-Minerals (MULTIVITAMIN WITH MINERALS) tablet Take 1 tablet by mouth daily. Centrum Silver for Women    . SIMETHICONE PO Take 1 tablet by mouth 2 (two) times daily.    . traMADol (ULTRAM) 50 MG tablet Take 1 tablet (50 mg total) by mouth every 6 (six) hours as needed. (Patient not taking: Reported on 05/14/2020) 10 tablet 0  . trazodone (DESYREL) 300 MG tablet Take 300 mg by mouth at bedtime.     No current facility-administered medications on file prior to visit.    ALLERGIES: Allergies  Allergen Reactions  . Gabapentin Itching    FAMILY HISTORY: Family History  Problem Relation Age of Onset  . Cancer Other   . Breast cancer Maternal Aunt     SOCIAL HISTORY: Social History   Socioeconomic History  . Marital status: Single    Spouse name: Not on file  . Number of children: 1  . Years of education: Not on file  . Highest education level: High school graduate  Occupational History  . Not on file  Tobacco Use  . Smoking status: Current Every Day Smoker    Packs/day: 0.50    Years: 34.00    Pack years: 17.00    Last attempt to quit: 08/13/2002    Years since quitting: 18.2  . Smokeless tobacco: Never Used  Vaping Use  . Vaping Use: Never used  Substance and Sexual Activity  . Alcohol use: Yes    Alcohol/week: 1.0 standard drink    Types: 1 Glasses of wine per week    Comment: daily-wine  . Drug use: No  . Sexual activity: Yes  Other Topics Concern  . Not on file  Social History Narrative   Single   1 child   Right handed   Drinks coffee, soda once a week mostly water   Social Determinants of Health   Financial Resource Strain: Not on file  Food Insecurity: Not on file   Transportation Needs: Not on file  Physical Activity: Not on file  Stress: Not on file  Social Connections: Not on file  Intimate Partner Violence: Not on file     Objective:  Blood pressure 133/83, pulse 83, height 5\' 7"  (1.702 m), SpO2 93 %. General: No acute distress.  Patient appears well-groomed.   Head:  Normocephalic/atraumatic Eyes:  Fundi examined but not visualized Neck: supple, no paraspinal tenderness, full range of motion Heart:  Regular rate and rhythm Lungs:  Clear to auscultation bilaterally Back: No paraspinal tenderness Neurological Exam: alert and oriented to person, place, and time. Speech fluent and not dysarthric, language intact.  CN II-XII intact. Bulk decreased, tone normal muscle strength 4+/5 bilateral proximal upper extremities and 5-/5 distally, 2/5 lower extremities.  Tremor in left hand.  Sensation to light touch, temperature and vibration intact.  Deep tendon reflexes 2+ throughout, toes downgoing.  Finger to nose testing intact.  Nonambulatory   Assessment/Plan:   Amyotrophic lateral sclerosis  Continue management with ALS clinic and palliative care.  Follow up in 6 months.  Shon Millet, DO  CC:  Shirlean Mylar, MD

## 2020-11-16 ENCOUNTER — Other Ambulatory Visit: Payer: Self-pay

## 2020-11-16 ENCOUNTER — Encounter: Payer: Self-pay | Admitting: Neurology

## 2020-11-16 ENCOUNTER — Ambulatory Visit: Payer: Medicare Other | Admitting: Neurology

## 2020-11-16 VITALS — BP 133/83 | HR 83 | Ht 67.0 in

## 2020-11-16 DIAGNOSIS — G1221 Amyotrophic lateral sclerosis: Secondary | ICD-10-CM

## 2020-11-23 DIAGNOSIS — G1221 Amyotrophic lateral sclerosis: Secondary | ICD-10-CM | POA: Diagnosis not present

## 2020-12-01 DIAGNOSIS — H2513 Age-related nuclear cataract, bilateral: Secondary | ICD-10-CM | POA: Diagnosis not present

## 2020-12-23 DIAGNOSIS — G1221 Amyotrophic lateral sclerosis: Secondary | ICD-10-CM | POA: Diagnosis not present

## 2021-01-23 DIAGNOSIS — G1221 Amyotrophic lateral sclerosis: Secondary | ICD-10-CM | POA: Diagnosis not present

## 2021-02-10 DIAGNOSIS — G1221 Amyotrophic lateral sclerosis: Secondary | ICD-10-CM | POA: Diagnosis not present

## 2021-02-22 DIAGNOSIS — G1221 Amyotrophic lateral sclerosis: Secondary | ICD-10-CM | POA: Diagnosis not present

## 2021-03-25 DIAGNOSIS — G1221 Amyotrophic lateral sclerosis: Secondary | ICD-10-CM | POA: Diagnosis not present

## 2021-05-21 ENCOUNTER — Ambulatory Visit: Payer: Medicare Other | Admitting: Neurology

## 2021-07-27 DEATH — deceased

## 2022-05-05 IMAGING — CT CT ABD-PELV W/ CM
2 of 5 series · 16 of 46 positions shown, 18 images · IV contrast (omnipaque)
Comparison: 08/28/2013

CLINICAL DATA: Lower abdominal pain.  Dysuria.

EXAM:
CT ABDOMEN AND PELVIS WITH CONTRAST
TECHNIQUE: Multidetector CT imaging of the abdomen and pelvis was performed
using the standard protocol following bolus administration of
intravenous contrast.
CONTRAST:  100mL OMNIPAQUE IOHEXOL 300 MG/ML  SOLN

[Series 1: axial st · axial · 0.83mm/px · z∈[-536,-126]mm · 13 of 96 slices shown, 15 images]
[im 7/96  soft-tissue]
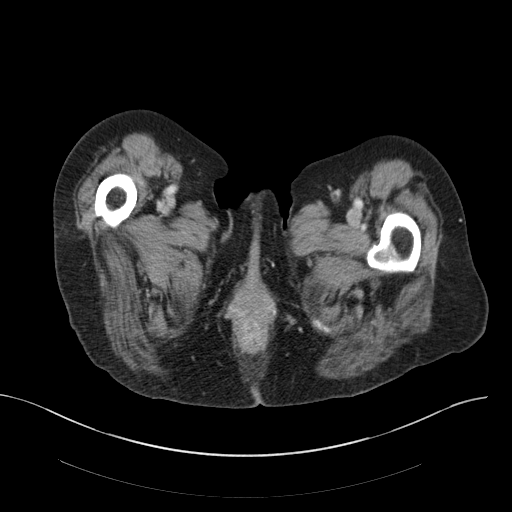
[im 7/96  bone]
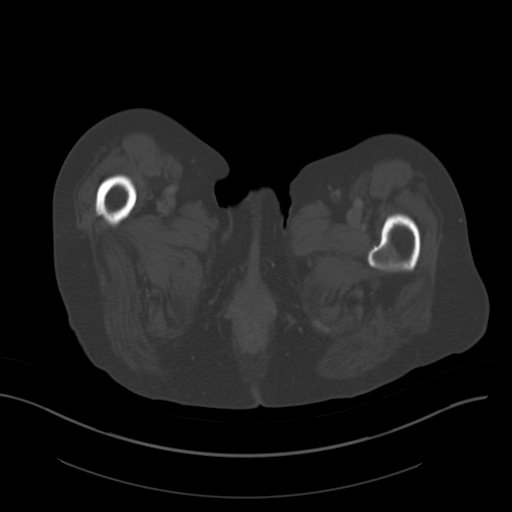
[im 13/96  soft-tissue]
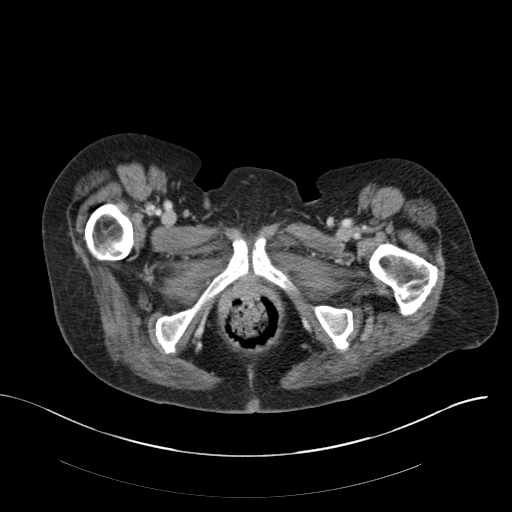
[im 20/96  soft-tissue]
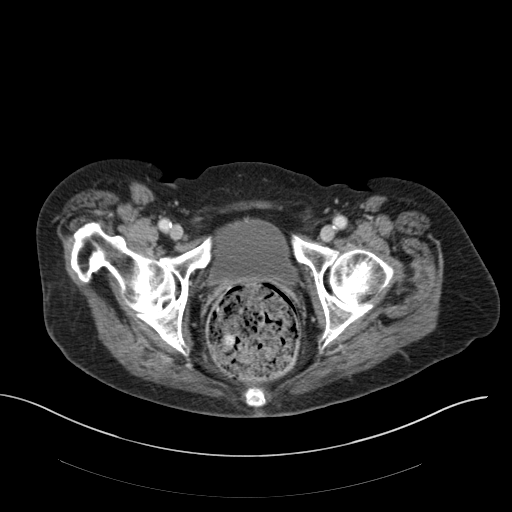
[im 26/96  soft-tissue]
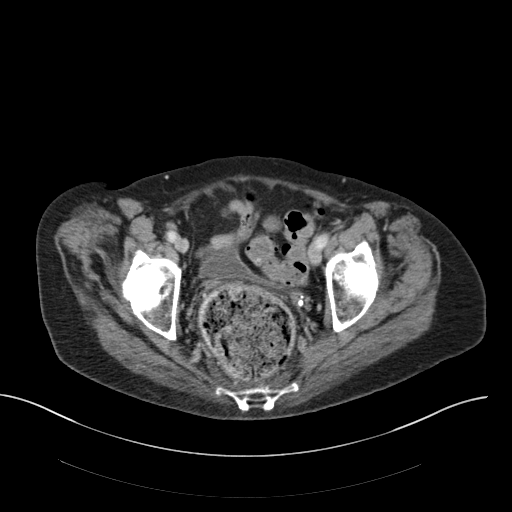
[im 32/96  soft-tissue]
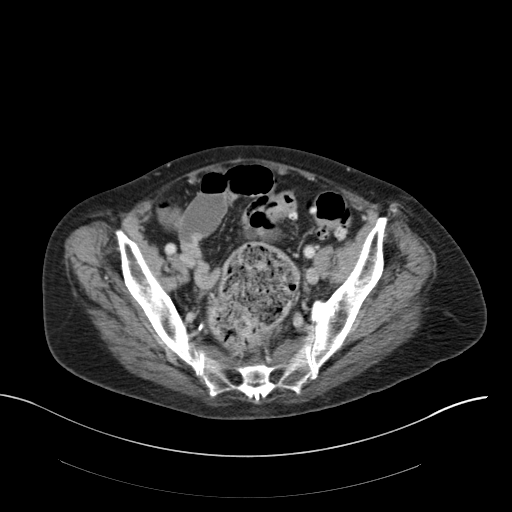
[im 39/96  soft-tissue]
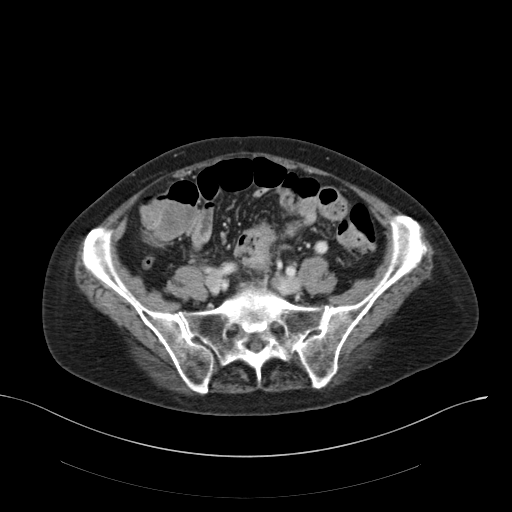
[im 51/96  soft-tissue]
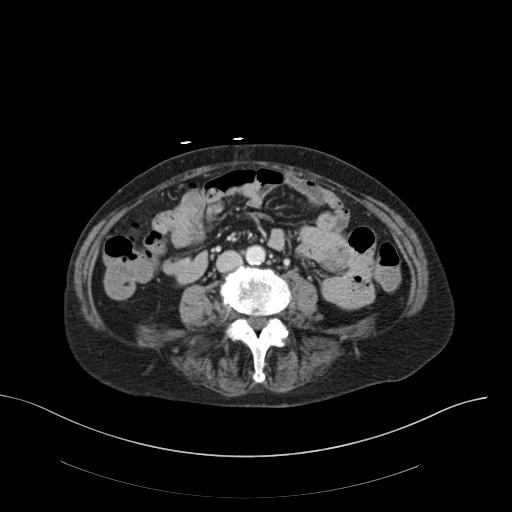
[im 58/96  soft-tissue]
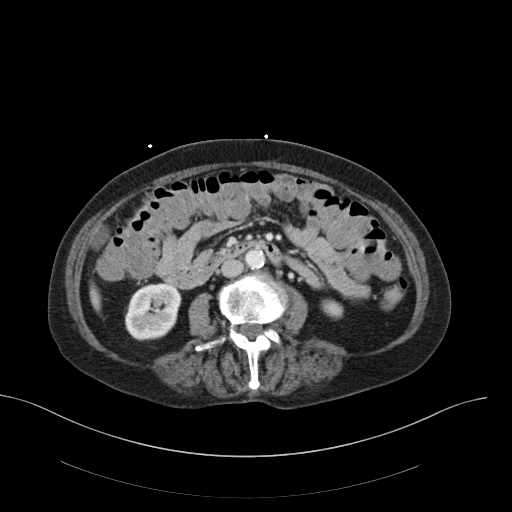
[im 64/96  soft-tissue]
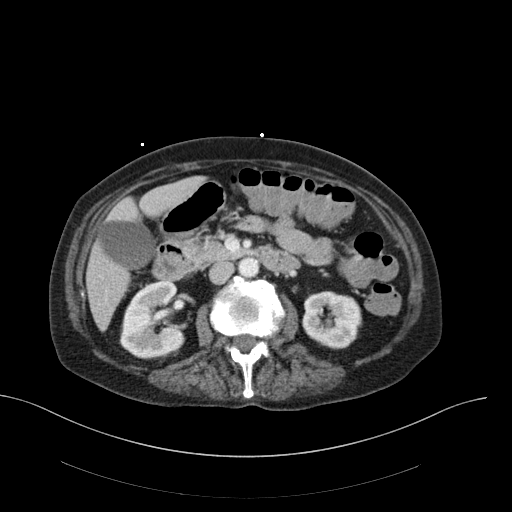
[im 64/96  bone]
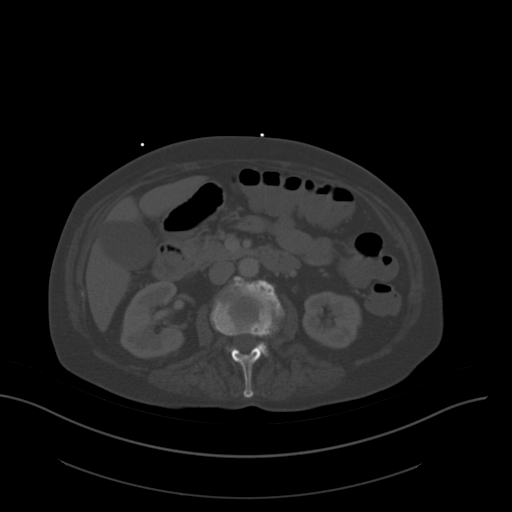
[im 70/96  soft-tissue]
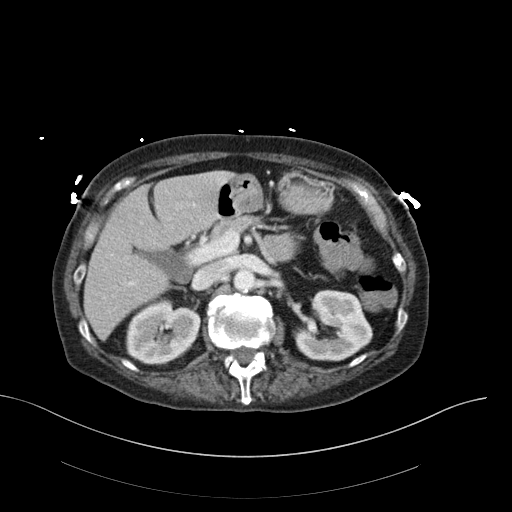
[im 77/96  soft-tissue]
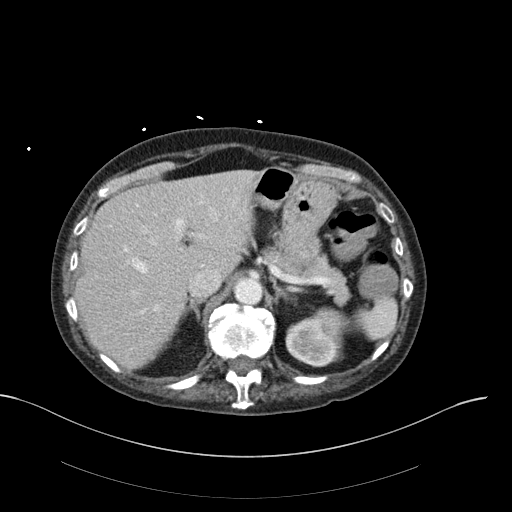
[im 83/96  soft-tissue]
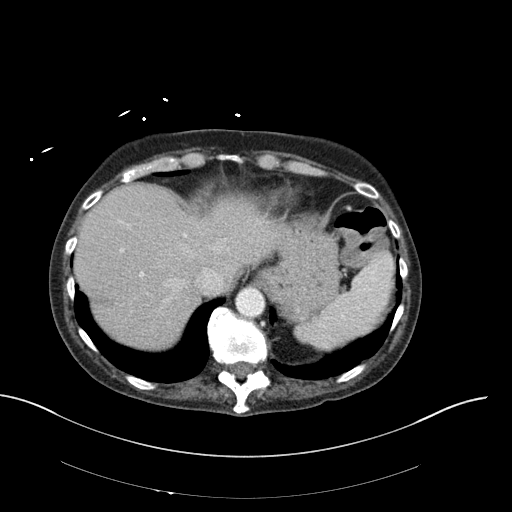
[im 89/96  soft-tissue]
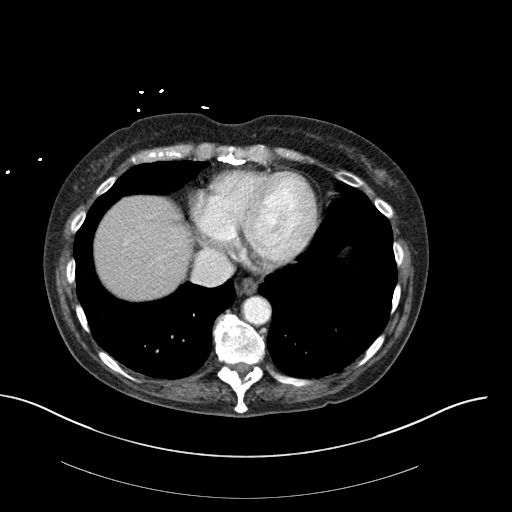

[Series 4: coronal st · coronal · 0.82mm/px · 3 of 118 slices shown]
[im 40/118  soft-tissue]
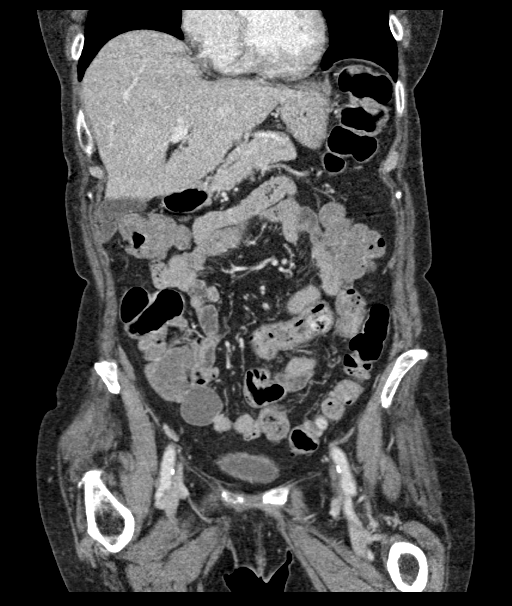
[im 53/118  soft-tissue]
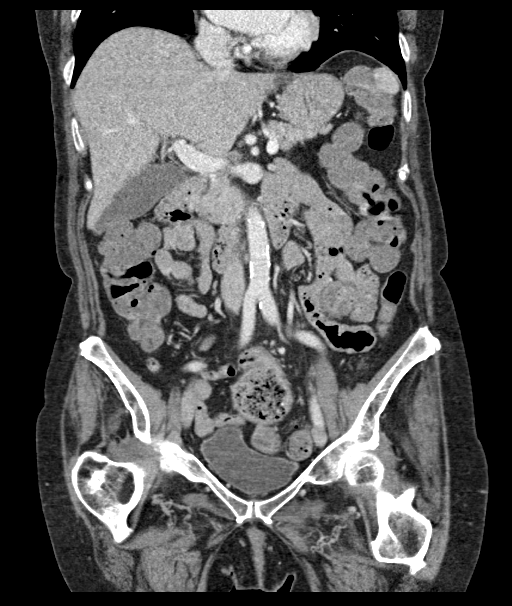
[im 66/118  soft-tissue]
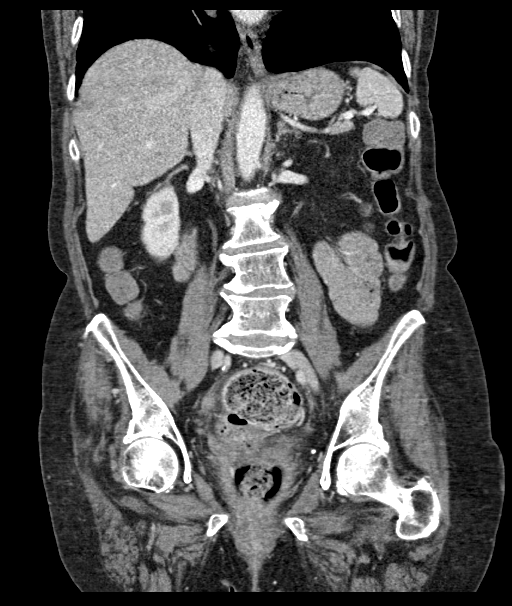

[16 of 46 positions shown; findings below may reference images not displayed]

FINDINGS: Lower chest: Unremarkable.

Hepatobiliary: 16 mm subcapsular cyst identified lateral segment
left liver. No suspicious focal abnormality within the liver
parenchyma. There is no evidence for gallstones, gallbladder wall
thickening, or pericholecystic fluid. No intrahepatic or
extrahepatic biliary dilation.

Pancreas: No focal mass lesion. No dilatation of the main duct. No
intraparenchymal cyst. No peripancreatic edema.

Spleen: No splenomegaly. No focal mass lesion.

Adrenals/Urinary Tract: No adrenal nodule or mass. Right kidney
unremarkable. 9 mm hypoattenuating lesion in the lower pole left
kidney is too small to characterize but likely a cyst. No evidence
for hydroureter. The urinary bladder appears normal for the degree
of distention.

Stomach/Bowel: Stomach is unremarkable. No gastric wall thickening.
No evidence of outlet obstruction. Duodenum is normally positioned
as is the ligament of Treitz. No small bowel wall thickening. No
small bowel dilatation. The terminal ileum is normal. No gross
colonic mass. No colonic wall thickening. Diverticular changes are
noted in the left colon without evidence of diverticulitis.
Prominent stool volume in the rectum.

Vascular/Lymphatic: There is abdominal aortic atherosclerosis
without aneurysm. There is no gastrohepatic or hepatoduodenal
ligament lymphadenopathy. No retroperitoneal or mesenteric
lymphadenopathy. No pelvic sidewall lymphadenopathy.

Reproductive: The uterus is surgically absent. There is no adnexal
mass.

Other: No intraperitoneal free fluid.

Musculoskeletal: No worrisome lytic or sclerotic osseous
abnormality.
IMPRESSION: 1. No acute findings in the abdomen or pelvis. Specifically, no
findings to explain the patient's history of lower abdominal pain.
2. Left colonic diverticulosis without diverticulitis and prominent
rectal stool volume.
3. Aortic Atherosclerosis (SUBLS-Z33.3).
